# Patient Record
Sex: Male | Born: 1980 | Race: White | Hispanic: No | Smoking: Former smoker
Health system: Southern US, Community
[De-identification: ages and names within clinical notes are randomized; demographics above are authoritative.]

## PROBLEM LIST (undated history)

## (undated) DIAGNOSIS — E349 Endocrine disorder, unspecified: Secondary | ICD-10-CM

## (undated) DIAGNOSIS — E785 Hyperlipidemia, unspecified: Secondary | ICD-10-CM

## (undated) DIAGNOSIS — E039 Hypothyroidism, unspecified: Secondary | ICD-10-CM

## (undated) DIAGNOSIS — I10 Essential (primary) hypertension: Secondary | ICD-10-CM

## (undated) HISTORY — DX: Hypothyroidism, unspecified: E03.9

## (undated) HISTORY — DX: Essential (primary) hypertension: I10

## (undated) HISTORY — DX: Hyperlipidemia, unspecified: E78.5

## (undated) HISTORY — PX: NO PAST SURGERIES: SHX2092

## (undated) HISTORY — DX: Endocrine disorder, unspecified: E34.9

---

## 2004-07-09 ENCOUNTER — Emergency Department (HOSPITAL_COMMUNITY): Admission: EM | Admit: 2004-07-09 | Discharge: 2004-07-09 | Payer: Self-pay | Admitting: Family Medicine

## 2005-02-19 ENCOUNTER — Emergency Department (HOSPITAL_COMMUNITY): Admission: EM | Admit: 2005-02-19 | Discharge: 2005-02-19 | Payer: Self-pay | Admitting: Family Medicine

## 2015-03-09 ENCOUNTER — Ambulatory Visit (INDEPENDENT_AMBULATORY_CARE_PROVIDER_SITE_OTHER): Payer: 59 | Admitting: Physician Assistant

## 2015-03-09 VITALS — BP 140/100 | HR 89 | Temp 98.5°F | Resp 16 | Ht 69.5 in | Wt 228.8 lb

## 2015-03-09 DIAGNOSIS — R0981 Nasal congestion: Secondary | ICD-10-CM

## 2015-03-09 DIAGNOSIS — Z113 Encounter for screening for infections with a predominantly sexual mode of transmission: Secondary | ICD-10-CM

## 2015-03-09 DIAGNOSIS — Z23 Encounter for immunization: Secondary | ICD-10-CM | POA: Diagnosis not present

## 2015-03-09 DIAGNOSIS — Z8679 Personal history of other diseases of the circulatory system: Secondary | ICD-10-CM | POA: Diagnosis not present

## 2015-03-09 DIAGNOSIS — Z139 Encounter for screening, unspecified: Secondary | ICD-10-CM

## 2015-03-09 DIAGNOSIS — IMO0001 Reserved for inherently not codable concepts without codable children: Secondary | ICD-10-CM

## 2015-03-09 DIAGNOSIS — Z0000 Encounter for general adult medical examination without abnormal findings: Secondary | ICD-10-CM

## 2015-03-09 DIAGNOSIS — R030 Elevated blood-pressure reading, without diagnosis of hypertension: Secondary | ICD-10-CM

## 2015-03-09 LAB — BASIC METABOLIC PANEL
BUN: 12 mg/dL (ref 7–25)
CALCIUM: 10 mg/dL (ref 8.6–10.3)
CO2: 24 mmol/L (ref 20–31)
Chloride: 100 mmol/L (ref 98–110)
Creat: 0.97 mg/dL (ref 0.60–1.35)
GLUCOSE: 77 mg/dL (ref 65–99)
Potassium: 4 mmol/L (ref 3.5–5.3)
Sodium: 140 mmol/L (ref 135–146)

## 2015-03-09 LAB — LIPID PANEL
CHOL/HDL RATIO: 6.8 ratio — AB (ref <=5.0)
CHOLESTEROL: 169 mg/dL (ref 125–200)
HDL: 25 mg/dL — ABNORMAL LOW (ref >=40)
LDL Cholesterol: 80 mg/dL (ref <130)
TRIGLYCERIDES: 318 mg/dL — AB (ref <150)
VLDL: 64 mg/dL — AB (ref <30)

## 2015-03-09 LAB — TSH: TSH: 6.935 u[IU]/mL — ABNORMAL HIGH (ref 0.350–4.500)

## 2015-03-09 MED ORDER — LISINOPRIL 10 MG PO TABS: 10.0000 mg | ORAL_TABLET | Freq: Every day | ORAL | Status: DC

## 2015-03-09 NOTE — Addendum Note (Signed)
Addended by: Honor LohGARRISON, Dezaray Shibuya M on: 03/09/2015 03:16 PM   Modules accepted: Orders

## 2015-03-09 NOTE — Progress Notes (Signed)
  Medical screening examination/treatment/procedure(s) were performed by non-physician practitioner and as supervising physician I was immediately available for consultation/collaboration.     

## 2015-03-09 NOTE — Progress Notes (Signed)
03/09/2015 2:50 PM   DOB: 02/26/1981 / MRN: [id number]  SUBJECTIVE:  Scott Sutton is a 34 y.o. male presenting for an annual physical.  He does not smoke.  He does not exercise.  He does not have a PCP.  He is sexually active with males and has a partner who lives in [place].  This is a monogamous relationship.  He was checked for STI a few years ago and his partner was as well.    Complains of a cold that has been present for 7 days and reports this is getting better.  Continues to have some nasal sinus drainage.  No cough, headache, fever.  He has not been taking any meds.    He has No Known Allergies.   He  has a past medical history of Hypertension.    He  reports that he has never smoked. He does not have any smokeless tobacco history on file. He  has no sexual activity history on file. The patient  has no past surgical history on file.  His family history is not on file.  Review of Systems  Constitutional: Negative for fever and chills.  Eyes: Negative for blurred vision.  Respiratory: Negative for cough and shortness of breath.   Cardiovascular: Negative for chest pain.  Gastrointestinal: Negative for nausea and abdominal pain.  Genitourinary: Negative for dysuria, urgency and frequency.  Musculoskeletal: Negative for myalgias.  Skin: Negative for rash.  Neurological: Negative for dizziness, tingling and headaches.  Psychiatric/Behavioral: Negative for depression. The patient is not nervous/anxious.     Problem list and medications reviewed and updated by myself where necessary, and exist elsewhere in the encounter.   OBJECTIVE:  BP 140/100 mmHg  Pulse 89  Temp(Src) 98.5 F (36.9 C) (Oral)  Resp 16  Ht 5' 9.5" (1.765 m)  Wt 228 lb 12.8 oz (103.783 kg)  BMI 33.31 kg/m2  SpO2 98%  Physical Exam  Constitutional: He is oriented to person, place, and time. He appears well-developed. He does not appear ill.  Eyes: Conjunctivae and EOM are normal. Pupils are  equal, round, and reactive to light.  Cardiovascular: Normal rate.   Pulmonary/Chest: Effort normal.  Abdominal: He exhibits no distension.  Musculoskeletal: Normal range of motion.  Neurological: He is alert and oriented to person, place, and time. No cranial nerve deficit. Coordination normal.  Skin: Skin is warm and dry. He is not diaphoretic.  Psychiatric: He has a normal mood and affect.  Nursing note and vitals reviewed.   No results found for this or any previous visit (from the past 48 hour(s)).  ASSESSMENT AND PLAN  Miles was seen today for annual exam, sinus problem, nasal congestion and ear problem.  Diagnoses and all orders for this visit:  Annual physical exam  Screening -     CBC with Differential -     Hemoglobin A1c -     Lipid panel -     TSH -     Basic metabolic panel -     HIV antibody  Needs flu shot -     Flu Vaccine QUAD 36+ mos IM (Fluarix) (Patient declined)  Nasal sinus congestion: Likely viral.  He is feeling better.  Will give OTC reqs per AVS.    Routine screening for STI (sexually transmitted infection) -     RPR -     GC probe amplification, urine  Hypertension:  He has a history of this and BP is elevated today.  Will  restart his lisinopril today.     The patient was advised to call or return to clinic if he does not see an improvement in symptoms or to seek the care of the closest emergency department if he worsens with the above plan.   Deliah BostonMichael Kendarius Vigen, MHS, PA-C [facility] 03/09/2015 2:50 PM

## 2015-03-09 NOTE — Patient Instructions (Signed)
-   You have a viral upper respiratory infection, (the common cold) and it is not uncommon for symptoms to last 10 days to 2 weeks, regardless of which medications you take. Unfortunately antibiotics will not help your symptoms as they target bacteria, and you are likely suffering from a virus. Additionally, 1 in 8 people who take antibiotics suffer mild to severe side effects.    - You would benefit from high dose ibuprofen. TAKE 600-800 mg of ibuprofen every 8 hours as needed to control low grade fever, fatigue, and pain.    - I also recommend that you take Zyrtec-D 5-120 every morning or Claritin-D 10-240 for the next five to 10 days. This medication will help you with nasal congestion, post nasal drip and sneezing. You will have to purchase this directly from the pharmacist   Viral URI Medications: Please consult the pharmacist if you have questions. 600-800 mg ibuprofen every 8 hours as needed for the next five to ten days 5-120 Zyrtec-D every morning for five to 10 days OR Claritin D 10-240 every morning for five days.  Please visit the CDC's website https://www.cdc.gov/getsmart/ to learn about appropriate and inappropriate uses of antibiotics.    

## 2015-03-10 LAB — CBC WITH DIFFERENTIAL/PLATELET
BASOS ABS: 0.1 10*3/uL (ref 0.0–0.1)
Basophils Relative: 1 % (ref 0–1)
Eosinophils Absolute: 0.1 10*3/uL (ref 0.0–0.7)
Eosinophils Relative: 1 % (ref 0–5)
HEMATOCRIT: 49.8 % (ref 39.0–52.0)
HEMOGLOBIN: 17.4 g/dL — AB (ref 13.0–17.0)
LYMPHS ABS: 1.9 10*3/uL (ref 0.7–4.0)
LYMPHS PCT: 27 % (ref 12–46)
MCH: 30.9 pg (ref 26.0–34.0)
MCHC: 34.9 g/dL (ref 30.0–36.0)
MCV: 88.5 fL (ref 78.0–100.0)
MPV: 9.9 fL (ref 8.6–12.4)
Monocytes Absolute: 0.5 10*3/uL (ref 0.1–1.0)
Monocytes Relative: 7 % (ref 3–12)
NEUTROS PCT: 64 % (ref 43–77)
Neutro Abs: 4.6 10*3/uL (ref 1.7–7.7)
Platelets: 235 10*3/uL (ref 150–400)
RBC: 5.63 MIL/uL (ref 4.22–5.81)
RDW: 14.1 % (ref 11.5–15.5)
WBC: 7.2 10*3/uL (ref 4.0–10.5)

## 2015-03-10 LAB — RPR

## 2015-03-10 LAB — HIV ANTIBODY (ROUTINE TESTING W REFLEX): HIV: NONREACTIVE

## 2015-03-10 LAB — GC/CHLAMYDIA PROBE AMP
CT PROBE, AMP APTIMA: NOT DETECTED
GC PROBE AMP APTIMA: NOT DETECTED

## 2015-03-10 LAB — HEMOGLOBIN A1C
Hgb A1c MFr Bld: 5.3 % (ref <5.7)
Mean Plasma Glucose: 105 mg/dL (ref <117)

## 2015-09-13 ENCOUNTER — Ambulatory Visit: Payer: 59 | Admitting: Physician Assistant

## 2015-10-04 ENCOUNTER — Ambulatory Visit (INDEPENDENT_AMBULATORY_CARE_PROVIDER_SITE_OTHER): Payer: 59 | Admitting: Physician Assistant

## 2015-10-04 VITALS — BP 136/84 | HR 98 | Temp 98.6°F | Resp 18 | Ht 69.5 in | Wt 229.0 lb

## 2015-10-04 DIAGNOSIS — I10 Essential (primary) hypertension: Secondary | ICD-10-CM | POA: Diagnosis not present

## 2015-10-04 DIAGNOSIS — G4719 Other hypersomnia: Secondary | ICD-10-CM

## 2015-10-04 DIAGNOSIS — R21 Rash and other nonspecific skin eruption: Secondary | ICD-10-CM

## 2015-10-04 DIAGNOSIS — R946 Abnormal results of thyroid function studies: Secondary | ICD-10-CM | POA: Diagnosis not present

## 2015-10-04 DIAGNOSIS — L299 Pruritus, unspecified: Secondary | ICD-10-CM

## 2015-10-04 DIAGNOSIS — R7989 Other specified abnormal findings of blood chemistry: Secondary | ICD-10-CM

## 2015-10-04 LAB — POCT SKIN KOH: Skin KOH, POC: NEGATIVE

## 2015-10-04 LAB — T4, FREE: FREE T4: 1 ng/dL (ref 0.8–1.8)

## 2015-10-04 LAB — TSH: TSH: 10.7 m[IU]/L — AB (ref 0.40–4.50)

## 2015-10-04 LAB — T3, FREE: T3 FREE: 3.1 pg/mL (ref 2.3–4.2)

## 2015-10-04 MED ORDER — LISINOPRIL 10 MG PO TABS: 10.0000 mg | ORAL_TABLET | Freq: Every day | ORAL | 2 refills | Status: DC

## 2015-10-04 MED ORDER — METRONIDAZOLE 1 % EX CREA: TOPICAL_CREAM | Freq: Every day | CUTANEOUS | 0 refills | Status: DC

## 2015-10-04 MED ORDER — CLOBETASOL PROPIONATE 0.05 % EX SHAM: MEDICATED_SHAMPOO | CUTANEOUS | 0 refills | Status: DC

## 2015-10-04 NOTE — Progress Notes (Signed)
10/04/2015 1:21 PM   DOB: 1980-04-09 / MRN: [id number]  SUBJECTIVE:  Scott Sutton is a 35 y.o. male presenting for   He is here for a TSH check today. He last check showed a mild elevation in TSH.  He has not fasted.  He complains of poor memory.  He feels this does not affect his work. He denies HA, dizziness, changes in vision.  He does not know if he snores but has been told that he does snore.  He reports feeling sleepy and tired daily and feels this is getting worse.  He has never had a sleep study. Reports his snoring started once he started gaining weight.   He complains of dry skin.  He has tried "everything" and has tried increasing his omega three, biotin, lotion.  He has tried vaseline but not consistently. He reports the rash on his face has been present for about 6 years, and his scalp has also been itching since that time.    Depression screen PHQ 2/9 10/04/2015  Decreased Interest 0  Down, Depressed, Hopeless 0  PHQ - 2 Score 0    He has No Known Allergies.   He  has a past medical history of Hypertension.    He  reports that he has never smoked. He has never used smokeless tobacco. He reports that he drinks alcohol. He reports that he does not use drugs. He  has no sexual activity history on file. The patient  has no past surgical history on file.  His family history is not on file.  Review of Systems  Constitutional: Negative for chills and fever.  HENT: Negative for congestion.   Respiratory: Negative for cough and shortness of breath.   Cardiovascular: Negative for chest pain and leg swelling.  Gastrointestinal: Negative for nausea.  Musculoskeletal: Negative for myalgias.  Skin: Positive for itching and rash.  Neurological: Negative for dizziness, tingling, tremors, sensory change, speech change, focal weakness, seizures, loss of consciousness and headaches.  Psychiatric/Behavioral: Negative for depression. The patient is not nervous/anxious.     Problem  list and medications reviewed and updated by myself where necessary, and exist elsewhere in the encounter.   OBJECTIVE:  BP 136/84   Pulse 98   Temp 98.6 F (37 C) (Oral)   Resp 18   Ht 5' 9.5" (1.765 m)   Wt 229 lb (103.9 kg)   SpO2 99%   BMI 33.33 kg/m   Physical Exam  Constitutional: He is oriented to person, place, and time. Vital signs are normal. He appears well-developed and well-nourished. No distress.  HENT:  Nose: Nose normal.  Mouth/Throat: Oropharynx is clear and moist. No oropharyngeal exudate.  Eyes: EOM are normal. Pupils are equal, round, and reactive to light. Right eye exhibits no discharge. Left eye exhibits no discharge. No scleral icterus.  Cardiovascular: Normal rate, regular rhythm and normal heart sounds.   Pulmonary/Chest: Effort normal and breath sounds normal.  Abdominal: Soft. Bowel sounds are normal.  Musculoskeletal: Normal range of motion. He exhibits no edema, tenderness or deformity.  Lymphadenopathy:    He has no cervical adenopathy.  Neurological: He is alert and oriented to person, place, and time. No cranial nerve deficit. He exhibits normal muscle tone. Coordination normal.  Skin: Skin is warm and dry. Rash (facial and scalp, non tender, erythematous) noted. He is not diaphoretic. No erythema. No pallor.  Psychiatric: He has a normal mood and affect. His behavior is normal. Judgment and thought content normal.  Wt Readings from Last 3 Encounters:  10/04/15 229 lb (103.9 kg)  03/09/15 228 lb 12.8 oz (103.8 kg)   Lab Results  Component Value Date   WBC 7.2 03/09/2015   HGB 17.4 (H) 03/09/2015   HCT 49.8 03/09/2015   MCV 88.5 03/09/2015   PLT 235 03/09/2015   No results found for: ALT, AST, GGT, ALKPHOS, BILITOT Lab Results  Component Value Date   NA 140 03/09/2015   K 4.0 03/09/2015   CL 100 03/09/2015   CO2 24 03/09/2015   Lab Results  Component Value Date   HGBA1C 5.3 03/09/2015   Lab Results  Component Value Date    CREATININE 0.97 03/09/2015      Results for orders placed or performed in visit on 10/04/15 (from the past 72 hour(s))  POCT Skin KOH     Status: None   Collection Time: 10/04/15  1:07 PM  Result Value Ref Range   Skin KOH, POC Negative     No results found.  ASSESSMENT AND PLAN  Scott Sutton was seen today for other.  Diagnoses and all orders for this visit:  Essential hypertension: I rechecked this at 126/84.  Will controlled.  Will continue the current plan and see him back in 6 months.  -     lisinopril (PRINIVIL,ZESTRIL) 10 MG tablet; Take 1 tablet (10 mg total) by mouth daily. -     Care order/instruction  Itchy scalp -     POCT Skin KOH -     Clobetasol Propionate 0.05 % shampoo; Wash the scalp daily with this medication.  Excessive daytime sleepiness -     Ambulatory referral to Neurology  Elevated TSH -     TSH -     T4, Free -     T3, Free -     Thyroid Peroxidase Antibody  Facial rash -     metronidazole (NORITATE) 1 % cream; Apply topically daily.    The patient was advised to call or return to clinic if he does not see an improvement in symptoms, or to seek the care of the closest emergency department if he worsens with the above plan.   Deliah Boston, MHS, PA-C [facility] 10/04/2015 1:21 PM

## 2015-10-04 NOTE — Patient Instructions (Addendum)
1.  Do not use lotion on your face.  Okay to use the metronidazole cream and vaseline.    2.  Please go to the neurology appointment and discuss your symptoms with Dr. Lacey Jensen. She will perform a sleep study if she feels this is necessary.   3.  I am evaluating your thyroid function today and will contact you with the results soon.   4.  Let's go ahead and try the stroid shampoo for itchy scalp.  It is okay to continue using Head and Shoulders as well.   5.  Come back in 6 months or sooner if you have a problem you would like to discuss.    IF you received an x-ray today, you will receive an invoice from [facility]. Please contact [facility] at [phone] with questions or concerns regarding your invoice.   IF you received labwork today, you will receive an invoice from [employer]. Please contact [facility] at [phone] with questions or concerns regarding your invoice.   Our billing staff will not be able to assist you with questions regarding bills from these companies.  You will be contacted with the lab results as soon as they are available. The fastest way to get your results is to activate your My Chart account. Instructions are located on the last page of this paperwork. If you have not heard from [place] regarding the results in 2 weeks, please contact this office.

## 2015-10-05 ENCOUNTER — Encounter: Payer: Self-pay | Admitting: Physician Assistant

## 2015-10-05 DIAGNOSIS — E039 Hypothyroidism, unspecified: Secondary | ICD-10-CM

## 2015-10-05 LAB — THYROID PEROXIDASE ANTIBODY: Thyroperoxidase Ab SerPl-aCnc: 175 IU/mL — ABNORMAL HIGH (ref <9)

## 2015-10-05 MED ORDER — LEVOTHYROXINE SODIUM 25 MCG PO TABS: 25.0000 ug | ORAL_TABLET | Freq: Every day | ORAL | 0 refills | Status: DC

## 2015-10-09 ENCOUNTER — Institutional Professional Consult (permissible substitution): Payer: 59 | Admitting: Neurology

## 2015-10-09 ENCOUNTER — Telehealth: Payer: Self-pay

## 2015-10-09 NOTE — Telephone Encounter (Signed)
Pt did not show for their appt with Dr. Dohmeier today.  

## 2015-10-25 ENCOUNTER — Institutional Professional Consult (permissible substitution): Payer: 59 | Admitting: Neurology

## 2016-07-12 ENCOUNTER — Other Ambulatory Visit: Payer: Self-pay | Admitting: Physician Assistant

## 2016-07-12 DIAGNOSIS — I10 Essential (primary) hypertension: Secondary | ICD-10-CM

## 2016-12-19 ENCOUNTER — Other Ambulatory Visit: Payer: Self-pay | Admitting: Physician Assistant

## 2016-12-19 DIAGNOSIS — I10 Essential (primary) hypertension: Secondary | ICD-10-CM

## 2018-01-16 ENCOUNTER — Encounter (HOSPITAL_COMMUNITY): Payer: Self-pay

## 2018-01-16 ENCOUNTER — Ambulatory Visit (HOSPITAL_COMMUNITY)
Admission: EM | Admit: 2018-01-16 | Discharge: 2018-01-16 | Disposition: A | Discharge status: Home / Self Care | Payer: 59 | Attending: Internal Medicine | Admitting: Internal Medicine

## 2018-01-16 DIAGNOSIS — I10 Essential (primary) hypertension: Secondary | ICD-10-CM

## 2018-01-16 DIAGNOSIS — Z760 Encounter for issue of repeat prescription: Secondary | ICD-10-CM

## 2018-01-16 MED ORDER — LISINOPRIL 10 MG PO TABS: 10.0000 mg | ORAL_TABLET | Freq: Every day | ORAL | 0 refills | Status: DC

## 2018-01-16 NOTE — ED Provider Notes (Signed)
[facility]    CSN: [id number] Arrival date & time: 01/16/18  8295     History   Chief Complaint Chief Complaint  Patient presents with  . Medication Refill    HPI Scott Sutton is a 37 y.o. male.   Patient is a 37 year old male presents here for medication refill on his lisinopril.  He reports that he has not had his medication in approximately 1 year.  He has been between insurances and unable to get his medication.  He is currently searching for a new primary care provider. He denies any dizziness, headaches, chest pain, shortness of breath, palpitations, lower extremity edema.  ROS per HPI      Past Medical History:  Diagnosis Date  . Hypertension     There are no active problems to display for this patient.   History reviewed. No pertinent surgical history.     Home Medications    Prior to Admission medications   Medication Sig Start Date End Date Taking? Authorizing Provider  Clobetasol Propionate 0.05 % shampoo Wash the scalp daily with this medication. 10/04/15   Ofilia Neas, PA-C  levothyroxine (LEVOTHROID) 25 MCG tablet Take 1 tablet (25 mcg total) by mouth daily before breakfast. 10/05/15   Ofilia Neas, PA-C  lisinopril (PRINIVIL,ZESTRIL) 10 MG tablet Take 1 tablet (10 mg total) by mouth daily. Make appt with PA Clark within 60 days. 01/16/18   Dahlia Byes A, NP  metronidazole (NORITATE) 1 % cream Apply topically daily. 10/04/15   Ofilia Neas, PA-C    Family History History reviewed. No pertinent family history.  Social History Social History   Tobacco Use  . Smoking status: Never Smoker  . Smokeless tobacco: Never Used  Substance Use Topics  . Alcohol use: Yes    Alcohol/week: 0.0 standard drinks  . Drug use: No     Allergies   Patient has no known allergies.   Review of Systems Review of Systems   Physical Exam Triage Vital Signs ED Triage Vitals  Enc Vitals Group     BP 01/16/18 0952 (!) 155/107   Pulse Rate 01/16/18 0952 (!) 105     Resp 01/16/18 0952 18     Temp 01/16/18 0952 98.9 F (37.2 C)     Temp Source 01/16/18 0952 Oral     SpO2 01/16/18 0952 97 %     Weight --      Height --      Head Circumference --      Peak Flow --      Pain Score 01/16/18 1004 0     Pain Loc --      Pain Edu? --      Excl. in GC? --    No data found.  Updated Vital Signs BP (!) 155/107 (BP Location: Left Arm)   Pulse (!) 105   Temp 98.9 F (37.2 C) (Oral)   Resp 18   SpO2 97%   Visual Acuity Right Eye Distance:   Left Eye Distance:   Bilateral Distance:    Right Eye Near:   Left Eye Near:    Bilateral Near:     Physical Exam  Constitutional: He is oriented to person, place, and time. He appears well-developed and well-nourished.  Very pleasant. Non toxic or ill appearing.   HENT:  Head: Normocephalic and atraumatic.  Eyes: Conjunctivae are normal.  Neck: Normal range of motion.  Cardiovascular: Normal rate, regular rhythm and normal heart sounds.  Pulmonary/Chest:  Effort normal and breath sounds normal.  Lungs clear in all fields. No dyspnea or distress. No retractions or nasal flaring.   Musculoskeletal: Normal range of motion.  Neurological: He is alert and oriented to person, place, and time.  Skin: Skin is warm and dry.  Psychiatric: He has a normal mood and affect.  Nursing note and vitals reviewed.    UC Treatments / Results  Labs (all labs ordered are listed, but only abnormal results are displayed) Labs Reviewed - No data to display  EKG None  Radiology No results found.  Procedures Procedures (including critical care time)  Medications Ordered in UC Medications - No data to display  Initial Impression / Assessment and Plan / UC Course  I have reviewed the triage vital signs and the nursing notes.  Pertinent labs & imaging results that were available during my care of the patient were reviewed by me and considered in my medical decision making (see  chart for details).     Refilled patient's blood pressure medication Gave patient contacts to multiple different primary care providers and instructed to follow-up for further management of blood pressure  Final Clinical Impressions(s) / UC Diagnoses   Final diagnoses:  Medication refill     Discharge Instructions     Please follow up with primary care for further management of BP.     ED Prescriptions    Medication Sig Dispense Auth. Provider   lisinopril (PRINIVIL,ZESTRIL) 10 MG tablet Take 1 tablet (10 mg total) by mouth daily. Make appt with PA Clark within 60 days. 60 tablet Dahlia Byes A, NP     Controlled Substance Prescriptions [place] Controlled Substance Registry consulted? Not Applicable   Janace Aris, NP 01/16/18 1151

## 2018-01-16 NOTE — ED Triage Notes (Signed)
Pt presents for medication refill for lisinopril.  Pt states he has not had it for 1 year because he does not have primary care yet.

## 2018-01-16 NOTE — Discharge Instructions (Signed)
Please follow up with primary care for further management of BP.

## 2018-04-12 NOTE — Progress Notes (Signed)
NEW PATIENT  Assessment and Plan:  Smoker -     DG Chest 2 View; Future - advised to quit vaping  Essential hypertension Start on lisinoprill 40, follow up 1 month With family history of discussed possibly getting MRA- declines at this time due to cost- will continue to monitor Discussed symptoms of hemorragic stroke -     CBC with Differential/Platelet -     COMPLETE METABOLIC PANEL WITH GFR -     TSH -     lisinopril (PRINIVIL,ZESTRIL) 40 MG tablet; Take 1 tablet (40 mg total) by mouth daily. -     DG Chest 2 View; Future  Hypothyroidism due to Hashimoto's thyroiditis Will start on meds pending thyroid -     TSH  Class 2 severe obesity due to excess calories with serious comorbidity and body mass index (BMI) of 35.0 to 35.9 in adult [facility]) - follow up 3 months for progress monitoring - increase veggies, decrease carbs - long discussion about weight loss, diet, and exercise -     Lipid panel -     Hemoglobin A1c  Medication management -     Magnesium  Vitamin D deficiency -     VITAMIN D 25 Hydroxy (Vit-D Deficiency, Fractures)  Screening, anemia, deficiency, iron -     Iron,Total/Total Iron Binding Cap -     Vitamin B12  Fatigue, unspecified type Need to correct thyroid Will check for other causes of fatigue Partner states that he does not snore- does work 3rd shift -     Iron,Total/Total Iron Binding Cap -     Vitamin B12 -     Testosterone  Eczema, unspecified type Correct thyroid and any def first  History of migraine Will monitor. Have not had lately   Discussed med's effects and SE's. Screening labs and tests as requested with regular follow-up as recommended. Over 40 minutes of exam, counseling, chart review and critical decision making was performed  HPI Patient presents for a complete physical as a new patient.   His blood pressure has been controlled at home, has had BP x teens, today their BP is BP: (!) 160/110.  He works 3rd shift. He does  not snore, does not stop breathing.  He is NOT on thyroid medication. He has history of low thyroid with  Lab Results  Component Value Date   TSH 10.70 (H) 10/04/2015  .  He has history of skin issues, told it is eczema, states clobetasol did not help.   He does not workout. He denies chest pain, shortness of breath, dizziness.   BMI is Body mass index is 35.37 kg/m., he is working on diet and exercise. Wt Readings from Last 3 Encounters:  04/13/18 243 lb (110.2 kg)  10/04/15 229 lb (103.9 kg)  03/09/15 228 lb 12.8 oz (103.8 kg)   The cholesterol last visit was:   Lab Results  Component Value Date   CHOL 169 03/09/2015   HDL 25 (L) 03/09/2015   LDLCALC 80 03/09/2015   TRIG 318 (H) 03/09/2015   CHOLHDL 6.8 (H) 03/09/2015    Last A1C in the office was:  Lab Results  Component Value Date   HGBA1C 5.3 03/09/2015    Current Medications:  Current Outpatient Medications on File Prior to Visit  Medication Sig Dispense Refill  . levothyroxine (LEVOTHROID) 25 MCG tablet Take 1 tablet (25 mcg total) by mouth daily before breakfast. 90 tablet 0   No current facility-administered medications on file prior to visit.  Allergies:  No Known Allergies   Health Maintenance:  There is no immunization history for the selected administration types on file for this patient.  Tetanus: Pneumovax: Prevnar 13: Flu vaccine: Zostavax:  DEXA: N/A Colonoscopy: due age 38 EGD: Eye Exam: Dentist:  Patient Care Team: Lucky CowboyMcKeown, William, MD as PCP - General (Internal Medicine)  Medical History:  has Hypertension; Hypothyroidism; and Smoker on their problem list. Surgical History:  He  has no past surgical history on file. Family History:  His family history includes AAA (abdominal aortic aneurysm) in his maternal grandmother; Aneurysm in his father; Cancer in his maternal grandfather and mother; Crohn's disease in his sister; Diabetes in his paternal aunt and paternal uncle;  Hypertension in his father and sister; Stroke in his father, maternal grandmother, and paternal grandfather. Social History:   reports that he quit smoking about 5 years ago. His smoking use included cigarettes. He has a 20.00 pack-year smoking history. He has never used smokeless tobacco. He reports previous alcohol use. He reports that he does not use drugs.   Review of Systems:  Review of Systems  Constitutional: Positive for malaise/fatigue. Negative for chills, diaphoresis, fever and weight loss.  HENT: Negative.   Eyes: Negative.   Respiratory: Negative.   Cardiovascular: Negative.   Gastrointestinal: Negative.   Genitourinary: Negative.   Musculoskeletal: Negative.   Skin: Negative.     Physical Exam: Estimated body mass index is 35.37 kg/m as calculated from the following:   Height as of this encounter: 5' 9.5" (1.765 m).   Weight as of this encounter: 243 lb (110.2 kg). BP (!) 160/110   Pulse 67   Temp 98.1 F (36.7 C)   Ht 5' 9.5" (1.765 m)   Wt 243 lb (110.2 kg)   SpO2 98%   BMI 35.37 kg/m  General Appearance: Well nourished, in no apparent distress.  Eyes: PERRLA, EOMs, conjunctiva no swelling or erythema, normal fundi and vessels.  Sinuses: No Frontal/maxillary tenderness  ENT/Mouth: Ext aud canals clear, normal light reflex with TMs without erythema, bulging. Good dentition. No erythema, swelling, or exudate on post pharynx. Tonsils not swollen or erythematous. Hearing normal.  Neck: Supple, thyroid normal. No bruits  Respiratory: Respiratory effort normal, BS equal bilaterally without rales, rhonchi, wheezing or stridor.  Cardio: RRR without murmurs, rubs or gallops. Brisk peripheral pulses without edema.  Chest: symmetric, with normal excursions and percussion.  Abdomen: Soft, nontender, no guarding, rebound, hernias, masses, or organomegaly.  Lymphatics: Non tender without lymphadenopathy.  Genitourinary:  Musculoskeletal: Full ROM all peripheral  extremities,5/5 strength, and normal gait.  Skin: Warm, dry without rashes, lesions, ecchymosis. Neuro: Cranial nerves intact, reflexes equal bilaterally. Normal muscle tone, no cerebellar symptoms. Sensation intact.  Psych: Awake and oriented X 3, normal affect, Insight and Judgment appropriate.   EKG: WNL no changes. AORTA SCAN: defer  Quentin Mullingmanda Nadya Hopwood 7:37 PM [facility]

## 2018-04-13 ENCOUNTER — Ambulatory Visit (INDEPENDENT_AMBULATORY_CARE_PROVIDER_SITE_OTHER): Payer: 59 | Admitting: Physician Assistant

## 2018-04-13 ENCOUNTER — Encounter: Payer: Self-pay | Admitting: Physician Assistant

## 2018-04-13 VITALS — BP 160/110 | HR 67 | Temp 98.1°F | Ht 69.5 in | Wt 243.0 lb

## 2018-04-13 DIAGNOSIS — R5383 Other fatigue: Secondary | ICD-10-CM

## 2018-04-13 DIAGNOSIS — E66812 Obesity, class 2: Secondary | ICD-10-CM

## 2018-04-13 DIAGNOSIS — Z6835 Body mass index (BMI) 35.0-35.9, adult: Secondary | ICD-10-CM

## 2018-04-13 DIAGNOSIS — E038 Other specified hypothyroidism: Secondary | ICD-10-CM | POA: Diagnosis not present

## 2018-04-13 DIAGNOSIS — Z8669 Personal history of other diseases of the nervous system and sense organs: Secondary | ICD-10-CM

## 2018-04-13 DIAGNOSIS — I10 Essential (primary) hypertension: Secondary | ICD-10-CM

## 2018-04-13 DIAGNOSIS — Z79899 Other long term (current) drug therapy: Secondary | ICD-10-CM | POA: Diagnosis not present

## 2018-04-13 DIAGNOSIS — Z130 Encounter for screening for diseases of the blood and blood-forming organs and certain disorders involving the immune mechanism: Secondary | ICD-10-CM

## 2018-04-13 DIAGNOSIS — F17200 Nicotine dependence, unspecified, uncomplicated: Secondary | ICD-10-CM

## 2018-04-13 DIAGNOSIS — E039 Hypothyroidism, unspecified: Secondary | ICD-10-CM | POA: Insufficient documentation

## 2018-04-13 DIAGNOSIS — L309 Dermatitis, unspecified: Secondary | ICD-10-CM

## 2018-04-13 DIAGNOSIS — E559 Vitamin D deficiency, unspecified: Secondary | ICD-10-CM

## 2018-04-13 DIAGNOSIS — E063 Autoimmune thyroiditis: Secondary | ICD-10-CM | POA: Diagnosis not present

## 2018-04-13 MED ORDER — LISINOPRIL 40 MG PO TABS: 40.0000 mg | ORAL_TABLET | Freq: Every day | ORAL | 1 refills | Status: DC

## 2018-04-13 NOTE — Patient Instructions (Addendum)
Do 1/2 pill for 3-5 days and then increase to a whole pill of your lisinopril  INFORMATION ABOUT YOUR XRAY  [facility] [place] go to radiology and give them your name. They will have the order and take you back. You do not any paper work, I should get the result back today or tomorrow. This order is good for a year.    Hypothyroidism  Hypothyroidism is when the thyroid gland does not make enough of certain hormones (it is underactive). The thyroid gland is a small gland located in the lower front part of the neck, just in front of the windpipe (trachea). This gland makes hormones that help control how the body uses food for energy (metabolism) as well as how the heart and brain function. These hormones also play a role in keeping your bones strong. When the thyroid is underactive, it produces too little of the hormones thyroxine (T4) and triiodothyronine (T3). What are the causes? This condition may be caused by:  Hashimoto's disease. This is a disease in which the body's disease-fighting system (immune system) attacks the thyroid gland. This is the most common cause.  Viral infections.  Pregnancy.  Certain medicines.  Birth defects.  Past radiation treatments to the head or neck for cancer.  Past treatment with radioactive iodine.  Past exposure to radiation in the environment.  Past surgical removal of part or all of the thyroid.  Problems with a gland in the center of the brain (pituitary gland).  Lack of enough iodine in the diet. What increases the risk? You are more likely to develop this condition if:  You are male.  You have a family history of thyroid conditions.  You use a medicine called lithium.  You take medicines that affect the immune system (immunosuppressants). What are the signs or symptoms? Symptoms of this condition include:  Feeling as though you have no energy (lethargy).  Not being able to tolerate cold.  Weight gain that is  not explained by a change in diet or exercise habits.  Lack of appetite.  Dry skin.  Coarse hair.  Menstrual irregularity.  Slowing of thought processes.  Constipation.  Sadness or depression. How is this diagnosed? This condition may be diagnosed based on:  Your symptoms, your medical history, and a physical exam.  Blood tests. You may also have imaging tests, such as an ultrasound or MRI. How is this treated? This condition is treated with medicine that replaces the thyroid hormones that your body does not make. After you begin treatment, it may take several weeks for symptoms to go away. Follow these instructions at home:  Take over-the-counter and prescription medicines only as told by your health care provider.  If you start taking any new medicines, tell your health care provider.  Keep all follow-up visits as told by your health care provider. This is important. ? As your condition improves, your dosage of thyroid hormone medicine may change. ? You will need to have blood tests regularly so that your health care provider can monitor your condition. Contact a health care provider if:  Your symptoms do not get better with treatment.  You are taking thyroid replacement medicine and you: ? Sweat a lot. ? Have tremors. ? Feel anxious. ? Lose weight rapidly. ? Cannot tolerate heat. ? Have emotional swings. ? Have diarrhea. ? Feel weak. Get help right away if you have:  Chest pain.  An irregular heartbeat.  A rapid heartbeat.  Difficulty breathing. Summary  Hypothyroidism is when the thyroid gland does not make enough of certain hormones (it is underactive).  When the thyroid is underactive, it produces too little of the hormones thyroxine (T4) and triiodothyronine (T3).  The most common cause is Hashimoto's disease, a disease in which the body's disease-fighting system (immune system) attacks the thyroid gland. The condition can also be caused by viral  infections, medicine, pregnancy, or past radiation treatment to the head or neck.  Symptoms may include weight gain, dry skin, constipation, feeling as though you do not have energy, and not being able to tolerate cold.  This condition is treated with medicine to replace the thyroid hormones that your body does not make. This information is not intended to replace advice given to you by your health care provider. Make sure you discuss any questions you have with your health care provider. Document Released: 02/25/2005 Document Revised: 02/05/2017 Document Reviewed: 02/05/2017 Elsevier Interactive Patient Education  2019 [employer]   Silent reflux: Not all heartburn burns...Marland KitchenMarland KitchenMarland Kitchen  What is LPR? Laryngopharyngeal reflux (LPR) or silent reflux is a condition in which acid that is made in the stomach travels up the esophagus (swallowing tube) and gets to the throat. Not everyone with reflux has a lot of heartburn or indigestion. In fact, many people with LPR never have heartburn. This is why LPR is called SILENT REFLUX, and the terms "Silent reflux" and "LPR" are often used interchangeably. Because LPR is silent, it is sometimes difficult to diagnose.  How can you tell if you have LPR?  Marland Kitchen Chronic hoarseness- Some people have hoarseness that comes and goes . throat clearing  . Cough . It can cause shortness of breath and cause asthma like symptoms. Marland Kitchen a feeling of a lump in the throat  . difficulty swallowing . a problem with too much nose and throat drainage.  . Some people will feel their esophagus spasm which feels like their heart beating hard and fast, this will usually be after a meal, at rest, or lying down at night.    How do I treat this? Treatment for LPR should be individualized, and your doctor will suggest the best treatment for you. Generally there are several treatments for LPR: . changing habits and diet to reduce reflux,  . medications to reduce stomach acid, and  . surgery  to prevent reflux. Most people with LPR need to modify how and when they eat, as well as take some medication, to get well. Sometimes, nonprescription liquid antacids, such as Maalox, Gelucil and Mylanta are recommended. When used, these antacids should be taken four times each day - one tablespoon one hour after each meal and before bedtime. Dietary and lifestyle changes alone are not often enough to control LPR - medications that reduce stomach acid are also usually needed. These must be prescribed by our doctor.   TIPS FOR REDUCING REFLUX AND LPR Control your LIFE-STYLE and your DIET! Marland Kitchen If you use tobacco, QUIT.  Marland Kitchen Smoking makes you reflux. After every cigarette you have some LPR.  . Don't wear clothing that is too tight, especially around the waist (trousers, corsets, belts).  . Do not lie down just after eating...in fact, do not eat within three hours of bedtime.  . You should be on a low-fat diet.  . Limit your intake of red meat.  . Limit your intake of butter.  Marland Kitchen Avoid fried foods.  . Avoid chocolate  . Avoid cheese.  Marland Kitchen Avoid eggs. Marland Kitchen Specifically avoid caffeine (especially  coffee and tea), soda pop (especially cola) and mints.  . Avoid alcoholic beverages, particularly in the evening.

## 2018-04-14 ENCOUNTER — Other Ambulatory Visit: Payer: Self-pay | Admitting: Physician Assistant

## 2018-04-14 LAB — TESTOSTERONE: Testosterone: 256 ng/dL (ref 250–827)

## 2018-04-14 LAB — COMPLETE METABOLIC PANEL WITH GFR
AG Ratio: 1.8 (calc) (ref 1.0–2.5)
ALBUMIN MSPROF: 4.9 g/dL (ref 3.6–5.1)
ALT: 32 U/L (ref 9–46)
AST: 18 U/L (ref 10–40)
Alkaline phosphatase (APISO): 65 U/L (ref 36–130)
BILIRUBIN TOTAL: 0.3 mg/dL (ref 0.2–1.2)
BUN: 22 mg/dL (ref 7–25)
CO2: 27 mmol/L (ref 20–32)
CREATININE: 1.2 mg/dL (ref 0.60–1.35)
Calcium: 10.1 mg/dL (ref 8.6–10.3)
Chloride: 102 mmol/L (ref 98–110)
GFR, EST AFRICAN AMERICAN: 88 mL/min/{1.73_m2} (ref >=60)
GFR, EST NON AFRICAN AMERICAN: 76 mL/min/{1.73_m2} (ref >=60)
GLUCOSE: 98 mg/dL (ref 65–99)
Globulin: 2.8 g/dL (calc) (ref 1.9–3.7)
Potassium: 3.8 mmol/L (ref 3.5–5.3)
Sodium: 141 mmol/L (ref 135–146)
TOTAL PROTEIN: 7.7 g/dL (ref 6.1–8.1)

## 2018-04-14 LAB — CBC WITH DIFFERENTIAL/PLATELET
ABSOLUTE MONOCYTES: 755 {cells}/uL (ref 200–950)
BASOS ABS: 69 {cells}/uL (ref 0–200)
Basophils Relative: 0.9 %
EOS PCT: 1.3 %
Eosinophils Absolute: 100 cells/uL (ref 15–500)
HEMATOCRIT: 48.2 % (ref 38.5–50.0)
HEMOGLOBIN: 17.2 g/dL — AB (ref 13.2–17.1)
LYMPHS ABS: 2302 {cells}/uL (ref 850–3900)
MCH: 31.7 pg (ref 27.0–33.0)
MCHC: 35.7 g/dL (ref 32.0–36.0)
MCV: 88.9 fL (ref 80.0–100.0)
MPV: 10.2 fL (ref 7.5–12.5)
Monocytes Relative: 9.8 %
NEUTROS ABS: 4474 {cells}/uL (ref 1500–7800)
NEUTROS PCT: 58.1 %
Platelets: 284 10*3/uL (ref 140–400)
RBC: 5.42 10*6/uL (ref 4.20–5.80)
RDW: 13 % (ref 11.0–15.0)
Total Lymphocyte: 29.9 %
WBC: 7.7 10*3/uL (ref 3.8–10.8)

## 2018-04-14 LAB — HEMOGLOBIN A1C
HEMOGLOBIN A1C: 5.4 %{Hb} (ref <5.7)
MEAN PLASMA GLUCOSE: 108 (calc)
eAG (mmol/L): 6 (calc)

## 2018-04-14 LAB — VITAMIN D 25 HYDROXY (VIT D DEFICIENCY, FRACTURES): Vit D, 25-Hydroxy: 8 ng/mL — ABNORMAL LOW (ref 30–100)

## 2018-04-14 LAB — LIPID PANEL
CHOL/HDL RATIO: 8.7 (calc) — AB (ref <5.0)
Cholesterol: 234 mg/dL — ABNORMAL HIGH (ref <200)
HDL: 27 mg/dL — ABNORMAL LOW (ref >=40)
NON-HDL CHOLESTEROL (CALC): 207 mg/dL — AB (ref <130)
Triglycerides: 1196 mg/dL — ABNORMAL HIGH (ref <150)

## 2018-04-14 LAB — IRON, TOTAL/TOTAL IRON BINDING CAP
%SAT: 23 % (calc) (ref 20–48)
Iron: 67 ug/dL (ref 50–180)
TIBC: 288 mcg/dL (calc) (ref 250–425)

## 2018-04-14 LAB — TSH: TSH: 10.54 mIU/L — ABNORMAL HIGH (ref 0.40–4.50)

## 2018-04-14 LAB — MAGNESIUM: Magnesium: 2.4 mg/dL (ref 1.5–2.5)

## 2018-04-14 LAB — VITAMIN B12: Vitamin B-12: 322 pg/mL (ref 200–1100)

## 2018-04-14 MED ORDER — FENOFIBRATE 145 MG PO TABS: 145.0000 mg | ORAL_TABLET | Freq: Every day | ORAL | 1 refills | Status: DC

## 2018-04-14 MED ORDER — VITAMIN D (ERGOCALCIFEROL) 1.25 MG (50000 UNIT) PO CAPS: ORAL_CAPSULE | ORAL | 0 refills | Status: DC

## 2018-04-14 MED ORDER — LEVOTHYROXINE SODIUM 75 MCG PO TABS: 75.0000 ug | ORAL_TABLET | Freq: Every day | ORAL | 1 refills | Status: DC

## 2018-04-14 NOTE — Progress Notes (Signed)
PRINTED

## 2018-05-13 DIAGNOSIS — Z6835 Body mass index (BMI) 35.0-35.9, adult: Secondary | ICD-10-CM

## 2018-05-13 DIAGNOSIS — Z8669 Personal history of other diseases of the nervous system and sense organs: Secondary | ICD-10-CM | POA: Insufficient documentation

## 2018-05-13 DIAGNOSIS — E7800 Pure hypercholesterolemia, unspecified: Secondary | ICD-10-CM | POA: Insufficient documentation

## 2018-05-13 DIAGNOSIS — E559 Vitamin D deficiency, unspecified: Secondary | ICD-10-CM | POA: Insufficient documentation

## 2018-05-13 DIAGNOSIS — L309 Dermatitis, unspecified: Secondary | ICD-10-CM | POA: Insufficient documentation

## 2018-05-13 NOTE — Progress Notes (Signed)
CPE  Assessment and Plan:  Essential hypertension - continue medications, DASH diet, exercise and monitor at home. Call if greater than 130/80.  -     TSH -     Urinalysis, Routine w reflex microscopic -     Microalbumin / creatinine urine ratio -     EKG 12-Lead -     DG Chest 2 View; Future  Hypothyroidism due to Hashimoto's thyroiditis -     TSH Hypothyroidism-check TSH level, continue medications the same, reminded to take on an empty stomach 30-30mins before food.   Smoker -     DG Chest 2 View; Future Smoking cessation-  instruction/counseling given, counseled patient on the dangers of tobacco use, advised patient to stop smoking, and reviewed strategies to maximize success, patient not ready to quit at this time.   Medication management  Class 2 severe obesity due to excess calories with serious comorbidity and body mass index (BMI) of 35.0 to 35.9 in adult [facility])  Overweight  - long discussion about weight loss, diet, and exercise -recommended diet heavy in fruits and veggies and low in animal meats, cheeses, and dairy products  Encounter for general adult medical examination with abnormal findings 1 year  Vitamin D deficiency -     VITAMIN D 25 Hydroxy (Vit-D Deficiency, Fractures)  Eczema, unspecified type -     ketoconazole (NIZORAL) 2 % cream; Apply 1 application topically 2 (two) times daily. -     fluocinonide cream (LIDEX) 0.05 %; Apply 1 application topically 2 (two) times daily.  History of migraine Have been controlled, with family history did discussed possible MRA however declines due to cost.   Hypercholesteremia -     Lipid panel - on fenofirbate, TSH better controlled -? Add on statin versus vacepa for trigs   Discussed med's effects and SE's. Screening labs and tests as requested with regular follow-up as recommended. Over 40 minutes of exam, counseling, chart review and critical decision making was performed  HPI Patient presents for a  complete physical as a new patient.   His blood pressure has been controlled at home, started on lisinopril 40 mg last visit, has had BP x teens, today their BP is BP: 136/84.  He works 3rd shift. He does not snore, does not stop breathing per partner.   B12 was low last visit, added supplement Lab Results  Component Value Date   VITAMINB12 322 04/13/2018   He was started on thyroid medication last visit. He has history of low thyroid with  Lab Results  Component Value Date   TSH 10.54 (H) 04/13/2018  .  He has history of skin issues, told it is eczema, has improved.   He does not workout. He denies chest pain, shortness of breath, dizziness.   BMI is Body mass index is 35.31 kg/m., he is working on diet and exercise. His testosterone was low normal last time.  Wt Readings from Last 3 Encounters:  05/15/18 242 lb 9.6 oz (110 kg)  04/13/18 243 lb (110.2 kg)  10/04/15 229 lb (103.9 kg)   Lab Results  Component Value Date   TESTOSTERONE 256 04/13/2018   The cholesterol last visit was VERY elevated putting him on risk for pancreatitis, need to recheck with thyroid correction, if not better will add on vasepa:   Lab Results  Component Value Date   CHOL 234 (H) 04/13/2018   HDL 27 (L) 04/13/2018   LDLCALC  04/13/2018     Comment:     .  LDL cholesterol not calculated. Triglyceride levels greater than 400 mg/dL invalidate calculated LDL results. . Reference range: <100 . Desirable range <100 mg/dL for primary prevention;   <70 mg/dL for patients with CHD or diabetic patients  with > or = 2 CHD risk factors. Marland Kitchen LDL-C is now calculated using the Martin-Hopkins  calculation, which is a validated novel method providing  better accuracy than the Friedewald equation in the  estimation of LDL-C.  Horald Pollen et al. Lenox Ahr. 4098;119(14): 2061-2068  (http://education.[facility].com/faq/FAQ164)    TRIG 1,196 (H) 04/13/2018   CHOLHDL 8.7 (H) 04/13/2018    Last A1C in the  office was:  Lab Results  Component Value Date   HGBA1C 5.4 04/13/2018   He is on 1 pill a week of vitamin D, will recheck today as well.  Vit D, 25-Hydroxy  Date Value Ref Range Status  04/13/2018 8 (L) 30 - 100 ng/mL Final    Comment:    Vitamin D Status         25-OH Vitamin D: . Deficiency:                    <20 ng/mL Insufficiency:             20 - 29 ng/mL Optimal:                 > or = 30 ng/mL . For 25-OH Vitamin D testing on patients on  D2-supplementation and patients for whom quantitation  of D2 and D3 fractions is required, the QuestAssureD(TM) 25-OH VIT D, (D2,D3), LC/MS/MS is recommended: order  code 78295 (patients >38yrs). . For more information on this test, go to: http://education.[facility].com/faq/FAQ163 (This link is being provided for  informational/educational purposes only.)     Current Medications:  Current Outpatient Medications on File Prior to Visit  Medication Sig Dispense Refill  . fenofibrate (TRICOR) 145 MG tablet Take 1 tablet (145 mg total) by mouth daily. 90 tablet 1  . levothyroxine (SYNTHROID) 75 MCG tablet Take 1 tablet (75 mcg total) by mouth daily. 90 tablet 1  . lisinopril (PRINIVIL,ZESTRIL) 40 MG tablet Take 1 tablet (40 mg total) by mouth daily. 90 tablet 1  . Vitamin D, Ergocalciferol, (DRISDOL) 1.25 MG (50000 UT) CAPS capsule 1 pill weekly for vitamin d deficiency 8 capsule 0   No current facility-administered medications on file prior to visit.    Allergies:  No Known Allergies   Health Maintenance:  There is no immunization history for the selected administration types on file for this patient.  Tetanus: DUE but declines Pneumovax: Prevnar 13: Flu vaccine: DECLINES Zostavax:  DEXA: N/A Colonoscopy: due age 24 EGD:  Patient Care Team: Lucky Cowboy, MD as PCP - General (Internal Medicine)  Medical History:  has Hypertension; Hypothyroidism; Smoker; Class 2 severe obesity due to excess calories with  serious comorbidity and body mass index (BMI) of 35.0 to 35.9 in adult [facility]); Vitamin D deficiency; Eczema; History of migraine; and Hypercholesteremia on their problem list. Surgical History:  He  has no past surgical history on file. Family History:  His family history includes AAA (abdominal aortic aneurysm) in his maternal grandmother; Aneurysm in his father; Cancer in his maternal grandfather and mother; Crohn's disease in his sister; Diabetes in his paternal aunt and paternal uncle; Hypertension in his father and sister; Stroke in his father, maternal grandmother, and paternal grandfather. Social History:   reports that he quit smoking about 5 years ago. His smoking use included cigarettes. He  has a 20.00 pack-year smoking history. He has never used smokeless tobacco. He reports previous alcohol use. He reports that he does not use drugs.   Review of Systems:  Review of Systems  Constitutional: Positive for malaise/fatigue. Negative for chills, diaphoresis, fever and weight loss.  HENT: Negative.   Eyes: Negative.   Respiratory: Negative.   Cardiovascular: Negative.   Gastrointestinal: Negative.   Genitourinary: Negative.   Musculoskeletal: Negative.   Skin: Negative.     Physical Exam: Estimated body mass index is 35.31 kg/m as calculated from the following:   Height as of this encounter: 5' 9.5" (1.765 m).   Weight as of this encounter: 242 lb 9.6 oz (110 kg). BP 136/84   Pulse 87   Temp 98.3 F (36.8 C)   Ht 5' 9.5" (1.765 m)   Wt 242 lb 9.6 oz (110 kg)   SpO2 98%   BMI 35.31 kg/m  General Appearance: Well nourished, in no apparent distress.  Eyes: PERRLA, EOMs, conjunctiva no swelling or erythema, normal fundi and vessels.  Sinuses: No Frontal/maxillary tenderness  ENT/Mouth: Ext aud canals clear, normal light reflex with TMs without erythema, bulging. Good dentition. No erythema, swelling, or exudate on post pharynx. Tonsils not swollen or erythematous. Hearing  normal.  Neck: Supple, thyroid normal. No bruits  Respiratory: Respiratory effort normal, BS equal bilaterally without rales, rhonchi, wheezing or stridor.  Cardio: RRR without murmurs, rubs or gallops. Brisk peripheral pulses without edema.  Chest: symmetric, with normal excursions and percussion.  Abdomen: Soft, nontender, no guarding, rebound, hernias, masses, or organomegaly.  Lymphatics: Non tender without lymphadenopathy.  Musculoskeletal: Full ROM all peripheral extremities,5/5 strength, and normal gait.  Skin: Warm, dry without rashes, lesions, ecchymosis. Neuro: Cranial nerves intact, reflexes equal bilaterally. Normal muscle tone, no cerebellar symptoms. Sensation intact.  Psych: Awake and oriented X 3, normal affect, Insight and Judgment appropriate.   EKG: WNL no ST changes. AORTA SCAN: defer  Quentin Mulling 8:43 AM [facility]

## 2018-05-15 ENCOUNTER — Encounter: Payer: Self-pay | Admitting: Physician Assistant

## 2018-05-15 ENCOUNTER — Ambulatory Visit (INDEPENDENT_AMBULATORY_CARE_PROVIDER_SITE_OTHER): Payer: 59 | Admitting: Physician Assistant

## 2018-05-15 VITALS — BP 136/84 | HR 87 | Temp 98.3°F | Ht 69.5 in | Wt 242.6 lb

## 2018-05-15 DIAGNOSIS — Z1389 Encounter for screening for other disorder: Secondary | ICD-10-CM | POA: Diagnosis not present

## 2018-05-15 DIAGNOSIS — Z1329 Encounter for screening for other suspected endocrine disorder: Secondary | ICD-10-CM | POA: Diagnosis not present

## 2018-05-15 DIAGNOSIS — Z79899 Other long term (current) drug therapy: Secondary | ICD-10-CM

## 2018-05-15 DIAGNOSIS — L309 Dermatitis, unspecified: Secondary | ICD-10-CM

## 2018-05-15 DIAGNOSIS — Z0000 Encounter for general adult medical examination without abnormal findings: Secondary | ICD-10-CM

## 2018-05-15 DIAGNOSIS — Z136 Encounter for screening for cardiovascular disorders: Secondary | ICD-10-CM

## 2018-05-15 DIAGNOSIS — F17200 Nicotine dependence, unspecified, uncomplicated: Secondary | ICD-10-CM

## 2018-05-15 DIAGNOSIS — E7800 Pure hypercholesterolemia, unspecified: Secondary | ICD-10-CM

## 2018-05-15 DIAGNOSIS — E063 Autoimmune thyroiditis: Secondary | ICD-10-CM

## 2018-05-15 DIAGNOSIS — E559 Vitamin D deficiency, unspecified: Secondary | ICD-10-CM | POA: Diagnosis not present

## 2018-05-15 DIAGNOSIS — Z13220 Encounter for screening for lipoid disorders: Secondary | ICD-10-CM | POA: Diagnosis not present

## 2018-05-15 DIAGNOSIS — E038 Other specified hypothyroidism: Secondary | ICD-10-CM

## 2018-05-15 DIAGNOSIS — I10 Essential (primary) hypertension: Secondary | ICD-10-CM

## 2018-05-15 DIAGNOSIS — Z6835 Body mass index (BMI) 35.0-35.9, adult: Secondary | ICD-10-CM

## 2018-05-15 DIAGNOSIS — Z0001 Encounter for general adult medical examination with abnormal findings: Secondary | ICD-10-CM

## 2018-05-15 DIAGNOSIS — Z8669 Personal history of other diseases of the nervous system and sense organs: Secondary | ICD-10-CM

## 2018-05-15 MED ORDER — KETOCONAZOLE 2 % EX CREA: 1.0000 "application " | TOPICAL_CREAM | Freq: Two times a day (BID) | CUTANEOUS | 0 refills | Status: DC

## 2018-05-15 MED ORDER — FLUOCINONIDE 0.05 % EX CREA: 1.0000 "application " | TOPICAL_CREAM | Freq: Two times a day (BID) | CUTANEOUS | 2 refills | Status: DC

## 2018-05-15 NOTE — Patient Instructions (Addendum)
INFORMATION ABOUT YOUR XRAY  Can walk into [address] for an Personal assistantxray. They will have the order and take you back. You do not any paper work, I should get the result back today or tomorrow. This order is good for a year.   GENERAL HEALTH GOALS  Know what a healthy weight is for you (roughly BMI <25) and aim to maintain this  Aim for 7+ servings of fruits and vegetables daily  70-80+ fluid ounces of water or unsweet tea for healthy kidneys  Limit to max 1 drink of alcohol per day; avoid smoking/tobacco  Limit animal fats in diet for cholesterol and heart health - choose grass fed whenever available  Avoid highly processed foods, and foods high in saturated/trans fats  Aim for low stress - take time to unwind and care for your mental health  Aim for 150 min of moderate intensity exercise weekly for heart health, and weights twice weekly for bone health  Aim for 7-9 hours of sleep daily   Can do zinc 40-50 mg a day Can try shake that is whey protein, almond milk, avocado oil, and spinach and strawberries in the morning  9 Ways to Naturally Increase Testosterone Levels  1.   Lose Weight If you're overweight, shedding the excess pounds may increase your testosterone levels, according to research presented at the Endocrine Society's 2012 meeting. Overweight men are more likely to have low testosterone levels to begin with, so this is an important trick to increase your body's testosterone production when you need it most.  2.   High-Intensity Exercise like Peak Fitness  Short intense exercise has a proven positive effect on increasing testosterone levels and preventing its decline. That's unlike aerobics or prolonged moderate exercise, which have shown to have negative or no effect on testosterone levels. Having a whey protein meal after exercise can further enhance the satiety/testosterone-boosting impact (hunger hormones cause the opposite effect on your testosterone and  libido). Here's a summary of what a typical high-intensity Peak Fitness routine might look like: " Warm up for three minutes  " Exercise as hard and fast as you can for 30 seconds. You should feel like you couldn't possibly go on another few seconds  " Recover at a slow to moderate pace for 90 seconds  " Repeat the high intensity exercise and recovery 7 more times .  3.   Consume Plenty of Zinc The mineral zinc is important for testosterone production, and supplementing your diet for as little as six weeks has been shown to cause a marked improvement in testosterone among men with low levels.1 Likewise, research has shown that restricting dietary sources of zinc leads to a significant decrease in testosterone, while zinc supplementation increases it2 -- and even protects men from exercised-induced reductions in testosterone levels.3 It's estimated that up to 45 percent of adults over the age of 60 may have lower than recommended zinc intakes; even when dietary supplements were added in, an estimated 20-25 percent of older adults still had inadequate zinc intakes, according to a [employer] and Nutrition Examination Survey.4 Your diet is the best source of zinc; along with protein-rich foods like meats and fish, other good dietary sources of zinc include raw milk, raw cheese, beans, and yogurt or kefir made from raw milk. It can be difficult to obtain enough dietary zinc if you're a vegetarian, and also for meat-eaters as well, largely because of conventional farming methods that rely heavily on chemical fertilizers and pesticides. These  chemicals deplete the soil of nutrients ... nutrients like zinc that must be absorbed by plants in order to be passed on to you. In many cases, you may further deplete the nutrients in your food by the way you prepare it. For most food, cooking it will drastically reduce its levels of nutrients like zinc ... particularly over-cooking, which many people do. If you  decide to use a zinc supplement, stick to a dosage of less than 40 mg a day, as this is the recommended adult upper limit. Taking too much zinc can interfere with your body's ability to absorb other minerals, especially copper, and may cause nausea as a side effect.  4.   Strength Training In addition to Peak Fitness, strength training is also known to boost testosterone levels, provided you are doing so intensely enough. When strength training to boost testosterone, you'll want to increase the weight and lower your number of reps, and then focus on exercises that work a large number of muscles, such as dead lifts or squats.  You can "turbo-charge" your weight training by going slower. By slowing down your movement, you're actually turning it into a high-intensity exercise. Super Slow movement allows your muscle, at the microscopic level, to access the maximum number of cross-bridges between the protein filaments that produce movement in the muscle.   5.   Optimize Your Vitamin D Levels Vitamin D, a steroid hormone, is essential for the healthy development of the nucleus of the sperm cell, and helps maintain semen quality and sperm count. Vitamin D also increases levels of testosterone, which may boost libido. In one study, overweight men who were given vitamin D supplements had a significant increase in testosterone levels after one year.5   6.   Reduce Stress When you're under a lot of stress, your body releases high levels of the stress hormone cortisol. This hormone actually blocks the effects of testosterone,6 presumably because, from a biological standpoint, testosterone-associated behaviors (mating, competing, aggression) may have lowered your chances of survival in an emergency (hence, the "fight or flight" response is dominant, courtesy of cortisol).  7.   Limit or Eliminate Sugar from Your Diet Testosterone levels decrease after you eat sugar, which is likely because the sugar leads to a high  insulin level, another factor leading to low testosterone.7 Based on USDA estimates, the average American consumes 12 teaspoons of sugar a day, which equates to about TWO TONS of sugar during a lifetime.  8.   Eat Healthy Fats By healthy, this means not only mon- and polyunsaturated fats, like that found in avocadoes and nuts, but also saturated, as these are essential for building testosterone. Research shows that a diet with less than 40 percent of energy as fat (and that mainly from animal sources, i.e. saturated) lead to a decrease in testosterone levels.8 My personal diet is about 60-70 percent healthy fat, and other experts agree that the ideal diet includes somewhere between 50-70 percent fat.  It's important to understand that your body requires saturated fats from animal and vegetable sources (such as meat, dairy, certain oils, and tropical plants like coconut) for optimal functioning, and if you neglect this important food group in favor of sugar, grains and other starchy carbs, your health and weight are almost guaranteed to suffer. Examples of healthy fats you can eat more of to give your testosterone levels a boost include: Olives and Olive oil  Coconuts and coconut oil Butter made from raw grass-fed organic milk Raw nuts, such  as, almonds or pecans Organic pastured egg yolks Avocados Grass-fed meats Palm oil Unheated organic nut oils   9.   Boost Your Intake of Branch Chain Amino Acids (BCAA) from Foods Like Whey Protein Research suggests that BCAAs result in higher testosterone levels, particularly when taken along with resistance training.9 While BCAAs are available in supplement form, you'll find the highest concentrations of BCAAs like leucine in dairy products - especially quality cheeses and whey protein. Even when getting leucine from your natural food supply, it's often wasted or used as a building block instead of an anabolic agent. So to create the correct anabolic environment,  you need to boost leucine consumption way beyond mere maintenance levels. That said, keep in mind that using leucine as a free form amino acid can be highly counterproductive as when free form amino acids are artificially administrated, they rapidly enter your circulation while disrupting insulin function, and impairing your body's glycemic control. Food-based leucine is really the ideal form that can benefit your muscles without side effects.

## 2018-05-16 LAB — LIPID PANEL
Cholesterol: 193 mg/dL (ref <200)
HDL: 35 mg/dL — ABNORMAL LOW (ref >=40)
LDL Cholesterol (Calc): 124 mg/dL (calc) — ABNORMAL HIGH
Non-HDL Cholesterol (Calc): 158 mg/dL (calc) — ABNORMAL HIGH (ref <130)
Total CHOL/HDL Ratio: 5.5 (calc) — ABNORMAL HIGH (ref <5.0)
Triglycerides: 227 mg/dL — ABNORMAL HIGH (ref <150)

## 2018-05-16 LAB — URINALYSIS, ROUTINE W REFLEX MICROSCOPIC
Bacteria, UA: NONE SEEN /HPF
Bilirubin Urine: NEGATIVE
Glucose, UA: NEGATIVE
Hgb urine dipstick: NEGATIVE
Hyaline Cast: NONE SEEN /LPF
Ketones, ur: NEGATIVE
Nitrite: NEGATIVE
Protein, ur: NEGATIVE
Specific Gravity, Urine: 1.025 (ref 1.001–1.03)
Squamous Epithelial / HPF: NONE SEEN /HPF (ref <=5)

## 2018-05-16 LAB — MICROALBUMIN / CREATININE URINE RATIO
Creatinine, Urine: 168 mg/dL (ref 20–320)
MICROALB UR: 2.8 mg/dL
Microalb Creat Ratio: 17 mcg/mg creat (ref <30)

## 2018-05-16 LAB — VITAMIN D 25 HYDROXY (VIT D DEFICIENCY, FRACTURES): Vit D, 25-Hydroxy: 28 ng/mL — ABNORMAL LOW (ref 30–100)

## 2018-05-16 LAB — TSH: TSH: 1.68 mIU/L (ref 0.40–4.50)

## 2018-06-06 ENCOUNTER — Other Ambulatory Visit: Payer: Self-pay | Admitting: Physician Assistant

## 2018-08-14 NOTE — Progress Notes (Signed)
Follow up  Assessment and Plan:  Essential hypertension -     TSH - continue medications, DASH diet, exercise and monitor at home. Call if greater than 130/80.   Class 2 severe obesity due to excess calories with serious comorbidity and body mass index (BMI) of 35.0 to 35.9 in adult (HCC) -     Insulin, random -     zinc gluconate 50 MG tablet; Take 1 tablet (50 mg total) by mouth daily. -     phentermine (ADIPEX-P) 37.5 MG tablet; Take 1 tablet (37.5 mg total) by mouth daily before breakfast. ? Would benefit from metformin  Hypothyroidism due to Hashimoto's thyroiditis -     TSH  Smoker Smoking cessation-  instruction/counseling given, counseled patient on the dangers of tobacco use, advised patient to stop smoking, and reviewed strategies to maximize success, patient not ready to quit at this time.   Vitamin D deficiency -     Vitamin D, Ergocalciferol, (DRISDOL) 1.25 MG (50000 UT) CAPS capsule; TAKE 1 CAPSULE BY MOUTH EVERY WEEK  Hypercholesteremia -     Lipid panel  B12 deficiency -     Cyanocobalamin (B-12) 500 MCG SUBL; One daily    Discussed med's effects and SE's. Screening labs and tests as requested with regular follow-up as recommended. Over 30 minutes of exam, counseling, chart review and critical decision making was performed  HPI Patient presents for follow up.   His blood pressure has been controlled at home, started on lisinopril 40 mg last visit, has had BP x teens, today their BP is BP: 140/76.   He works 3rd shift. He does not snore, does not stop breathing per partner. He states that he is very tired all the time still, he wants to eat all the time. He states may be worse after eating carbs. He also complains of his libido being obsolete. He has not mastrubated in 1 month, he will still have morning erections but does not achieve full erection. States he wants to want to have intercourse. He use to be on medications for anxiety/depression, states he was on a  handful, but he felt he was disconnected and did not like it. He did not tolerate prozac. He states currently he feels more anxiety.   BMI is Body mass index is 36.39 kg/m., he is working on diet and exercise. Wt Readings from Last 3 Encounters:  08/17/18 250 lb (113.4 kg)  05/15/18 242 lb 9.6 oz (110 kg)  04/13/18 243 lb (110.2 kg)   States he gained 80 lbs in 2 years, when he was a lower weight he states he had his sugars drop.  Lab Results  Component Value Date   HGBA1C 5.4 04/13/2018   B12 was low last visit, added supplement BUT HAS NOT BEEN TAKEN REGULARLY Lab Results  Component Value Date   VITAMINB12 322 04/13/2018   He was started on thyroid medication last visit. He has history of low thyroid with  Lab Results  Component Value Date   TSH 1.68 05/15/2018  .  He has history of skin issues, told it is eczema, has improved.   He does not workout. He denies chest pain, shortness of breath, dizziness.   Lab Results  Component Value Date   TESTOSTERONE 256 04/13/2018   The cholesterol last visit was VERY elevated putting him on risk for pancreatitis,HE IS ON FENOFIBRATE Lab Results  Component Value Date   CHOL 193 05/15/2018   HDL 35 (L) 05/15/2018  LDLCALC 124 (H) 05/15/2018   TRIG 227 (H) 05/15/2018   CHOLHDL 5.5 (H) 05/15/2018   He is on 1 pill a week of vitamin D, will recheck today as well.  Vit D, 25-Hydroxy  Date Value Ref Range Status  05/15/2018 28 (L) 30 - 100 ng/mL Final    Comment:    Vitamin D Status         25-OH Vitamin D: . Deficiency:                    <20 ng/mL Insufficiency:             20 - 29 ng/mL Optimal:                 > or = 30 ng/mL . For 25-OH Vitamin D testing on patients on  D2-supplementation and patients for whom quantitation  of D2 and D3 fractions is required, the Quest[facility]ureD(TM) 25-OH VIT D, (D2,D3), LC/MS/MS is recommended: order  code 16109 (patients >52yrs). . For more information on this test, go  to: http://education.questdiagnostics.com/faq/FAQ163 (This link is being provided for  informational/educational purposes only.)     Current Medications:  Current Outpatient Medications on File Prior to Visit  Medication Sig Dispense Refill  . fenofibrate (TRICOR) 145 MG tablet Take 1 tablet (145 mg total) by mouth daily. 90 tablet 1  . fluocinonide cream (LIDEX) 0.05 % Apply 1 application topically 2 (two) times daily. 60 g 2  . ketoconazole (NIZORAL) 2 % cream Apply 1 application topically 2 (two) times daily. 15 g 0  . levothyroxine (SYNTHROID) 75 MCG tablet Take 1 tablet (75 mcg total) by mouth daily. 90 tablet 1  . lisinopril (PRINIVIL,ZESTRIL) 40 MG tablet Take 1 tablet (40 mg total) by mouth daily. 90 tablet 1  . Vitamin D, Ergocalciferol, (DRISDOL) 1.25 MG (50000 UT) CAPS capsule TAKE 1 CAPSULE BY MOUTH EVERY WEEK 8 capsule 0   No current facility-administered medications on file prior to visit.    Allergies:  No Known Allergies    Medical History:  has Hypertension; Hypothyroidism; Smoker; Class 2 severe obesity due to excess calories with serious comorbidity and body mass index (BMI) of 35.0 to 35.9 in adult [facility]); Vitamin D deficiency; Eczema; History of migraine; and Hypercholesteremia on their problem list. Surgical History:  He  has no past surgical history on file. Family History:  His family history includes AAA (abdominal aortic aneurysm) in his maternal grandmother; Aneurysm in his father; Cancer in his maternal grandfather and mother; Crohn's disease in his sister; Diabetes in his paternal aunt and paternal uncle; Hypertension in his father and sister; Stroke in his father, maternal grandmother, and paternal grandfather. Social History:   reports that he quit smoking about 5 years ago. His smoking use included cigarettes. He has a 20.00 pack-year smoking history. He has never used smokeless tobacco. He reports previous alcohol use. He reports that he does not use  drugs.   Review of Systems:  Review of Systems  Constitutional: Positive for malaise/fatigue. Negative for chills, diaphoresis, fever and weight loss.  HENT: Negative.   Eyes: Negative.   Respiratory: Negative.   Cardiovascular: Negative.   Gastrointestinal: Negative.   Genitourinary: Negative.   Musculoskeletal: Negative.   Skin: Negative.     Physical Exam: Estimated body mass index is 36.39 kg/m as calculated from the following:   Height as of this encounter: 5' 9.5" (1.765 m).   Weight as of this encounter: 250 lb (113.4 kg). BP  140/76   Pulse (!) 103   Temp 97.7 F (36.5 C)   Ht 5' 9.5" (1.765 m)   Wt 250 lb (113.4 kg)   SpO2 98%   BMI 36.39 kg/m  General Appearance: Well nourished, in no apparent distress.  Eyes: PERRLA, EOMs, conjunctiva no swelling or erythema, normal fundi and vessels.  Sinuses: No Frontal/maxillary tenderness  ENT/Mouth: Ext aud canals clear, normal light reflex with TMs without erythema, bulging. Good dentition. No erythema, swelling, or exudate on post pharynx. Tonsils not swollen or erythematous. Hearing normal.  Neck: Supple, thyroid normal. No bruits  Respiratory: Respiratory effort normal, BS equal bilaterally without rales, rhonchi, wheezing or stridor.  Cardio: RRR without murmurs, rubs or gallops. Brisk peripheral pulses without edema.  Chest: symmetric, with normal excursions and percussion.  Abdomen: Soft, nontender, no guarding, rebound, hernias, masses, or organomegaly.  Lymphatics: Non tender without lymphadenopathy.  Musculoskeletal: Full ROM all peripheral extremities,5/5 strength, and normal gait.  Skin: Warm, dry without rashes, lesions, ecchymosis. Neuro: Cranial nerves intact, reflexes equal bilaterally. Normal muscle tone, no cerebellar symptoms. Sensation intact.  Psych: Awake and oriented X 3, normal affect, Insight and Judgment appropriate.    Quentin MullingAmanda Kurtis Anastasia 4:16 PM [facility]

## 2018-08-17 ENCOUNTER — Ambulatory Visit (INDEPENDENT_AMBULATORY_CARE_PROVIDER_SITE_OTHER): Payer: Self-pay | Admitting: Physician Assistant

## 2018-08-17 ENCOUNTER — Encounter: Payer: Self-pay | Admitting: Physician Assistant

## 2018-08-17 ENCOUNTER — Other Ambulatory Visit: Payer: Self-pay

## 2018-08-17 VITALS — BP 140/76 | HR 103 | Temp 97.7°F | Ht 69.5 in | Wt 250.0 lb

## 2018-08-17 DIAGNOSIS — E538 Deficiency of other specified B group vitamins: Secondary | ICD-10-CM

## 2018-08-17 DIAGNOSIS — E559 Vitamin D deficiency, unspecified: Secondary | ICD-10-CM

## 2018-08-17 DIAGNOSIS — E038 Other specified hypothyroidism: Secondary | ICD-10-CM

## 2018-08-17 DIAGNOSIS — I10 Essential (primary) hypertension: Secondary | ICD-10-CM

## 2018-08-17 DIAGNOSIS — Z6835 Body mass index (BMI) 35.0-35.9, adult: Secondary | ICD-10-CM

## 2018-08-17 DIAGNOSIS — F17200 Nicotine dependence, unspecified, uncomplicated: Secondary | ICD-10-CM

## 2018-08-17 DIAGNOSIS — E611 Iron deficiency: Secondary | ICD-10-CM

## 2018-08-17 DIAGNOSIS — E7800 Pure hypercholesterolemia, unspecified: Secondary | ICD-10-CM

## 2018-08-17 DIAGNOSIS — E063 Autoimmune thyroiditis: Secondary | ICD-10-CM

## 2018-08-17 MED ORDER — PHENTERMINE HCL 37.5 MG PO TABS: 37.5000 mg | ORAL_TABLET | Freq: Every day | ORAL | 2 refills | Status: DC

## 2018-08-17 MED ORDER — VITAMIN D (ERGOCALCIFEROL) 1.25 MG (50000 UNIT) PO CAPS: ORAL_CAPSULE | ORAL | 3 refills | Status: DC

## 2018-08-17 MED ORDER — B-12 500 MCG SL SUBL: SUBLINGUAL_TABLET | SUBLINGUAL | 3 refills | Status: AC

## 2018-08-17 MED ORDER — ZINC GLUCONATE 50 MG PO TABS: 50.0000 mg | ORAL_TABLET | Freq: Every day | ORAL | Status: AC

## 2018-08-17 NOTE — Patient Instructions (Addendum)
MAKE SURE ON B12 SUBLINGUAL MAKE SURE ON ZINC 50 MG  We are starting you on Metformin to prevent or treat diabetes.  Metformin does NOT cause low blood sugars.   In order to create energy your cells need insulin and sugar but sometime your cells do not accept the insulin and this can cause increased sugars and decreased energy.   The Metformin helps your cells accept insulin and the sugar this help: 1) increase your energy  2) weight loss.    The two most common side effects are nausea and diarrhea, follow these rules to avoid it but these symptoms get better with time on the medication.    ALSO You can take imodium per box instructions when starting metformin if needed.   Rules of metformin: 1) start out slow with only one pill daily. Our goal for you is 4 pills a day or 2000mg  total.  2) take with your largest meal. 3) Take with least amount of carbs.   Phentermine  While taking the medication we may ask that you come into the office once a month for the first month for a blood pressure check and EKG.   Also please bring in a food log for that visit to review. It is helpful if you bring in a food diary or use an app on your phone such as myfitnesspal to record your calorie intake, especially in the beginning. BRING FOR YOUR FIRST VISIT.  After that first initial visit, we will want to see you once every 2-3 months to monitor your weight, blood pressure, and heart rate.   In addition we can help answer your questions about diet, exercise, and help you every step of the way with your weight loss journey.  You can start out on 1/3 to 1/2 a pill in the morning and if you are tolerating it well you can increase to one pill daily. I also have some patients that take 1/3 or 1/2 at lunch to help prevent night time eating.  This medication is cheapest [facility] at [facility] [employer] [facility] is 14-17 dollars and you do NOT need a membership to get meds from there.   It causes dry mouth  and constipation in almost every patient, so try to get 80-100 oz of water a day and increase fiber such as veggies. You can add on a stool softener if you would like.   It can give you energy however it can also cause some people to be shaky, anxious or have palpitations. Stop this medication if that happens and contact the office.   If this medication does not work for you there are several medications that we can try to help rewire your brain in addition to making healthier habits.   What is this medicine? PHENTERMINE (FEN ter meen) decreases your appetite. This medicine is intended to be used in addition to a healthy reduced calorie diet and exercise. The best results are achieved this way. This medicine is only indicated for short-term use. Eventually your weight loss may level out and the medication will no longer be needed.   How should I use this medicine? Take this medicine by mouth. Follow the directions on the prescription label. The tablets should stay in the bottle until immediately before you take your dose. Take your doses at regular intervals. Do not take your medicine more often than directed.  Overdosage: If you think you have taken too much of this medicine contact a poison control  center or emergency room at once. NOTE: This medicine is only for you. Do not share this medicine with others.  What if I miss a dose? If you miss a dose, take it as soon as you can. If it is almost time for your next dose, take only that dose. Do not take double or extra doses. Do not increase or in any way change your dose without consulting your doctor.  What should I watch for while using this medicine? Notify your physician immediately if you become short of breath while doing your normal activities. Do not take this medicine within 6 hours of bedtime. It can keep you from getting to sleep. Avoid drinks that contain caffeine and try to stick to a regular bedtime every night. Do not stand or sit  up quickly, especially if you are an older patient. This reduces the risk of dizzy or fainting spells. Avoid alcoholic drinks.  What side effects may I notice from receiving this medicine? Side effects that you should report to your doctor or health care professional as soon as possible: -chest pain, palpitations -depression or severe changes in mood -increased blood pressure -irritability -nervousness or restlessness -severe dizziness -shortness of breath -problems urinating -unusual swelling of the legs -vomiting  Side effects that usually do not require medical attention (report to your doctor or health care professional if they continue or are bothersome): -blurred vision or other eye problems -changes in sexual ability or desire -constipation or diarrhea -difficulty sleeping -dry mouth or unpleasant taste -headache -nausea This list may not describe all possible side effects. Call your doctor for medical advice about side effects. You may report side effects to FDA at 1-800-FDA-1088.   Call if you have any problems.    Insulin Resistance  Insulin is a hormone that helps to control blood sugar (glucose) levels in the body. It is made in the pancreas. Insulin allows glucose to enter cells in the body. Insulin sensitivity refers to how the body responds to insulin. Insulin resistance occurs when cells in the body do not respond properly to insulin made by the pancreas and are not able to absorb glucose from the bloodstream. Insulin resistance results in high blood glucose levels (hyperglycemia) and can lead to problems, including:  Prediabetes.  Type 2 diabetes (type 2 diabetes mellitus).  Heart disease.  High blood pressure (hypertension).  Stroke.  Polycystic ovary syndrome (PCOS).  Nonalcoholic fatty liver disease. What are the causes? The exact cause of insulin resistance is not known. What increases the risk? The following factors may make you more likely to  develop insulin resistance:  Being overweight or obese, especially if a lot of your weight is in your waist area.  Having an inactive (sedentary) lifestyle.  Using steroids.  Being older than age 98.  Having sleep apnea.  Using tobacco products. What are the signs or symptoms? This condition usually does not cause symptoms. How is this diagnosed? There is no test to diagnose insulin resistance. However, your health care provider may diagnose insulin resistance based on:  Your blood glucose levels.  Your cholesterol levels.  A measurement of the distance around your waist (circumference). A waist circumference of more than 35 inches (88.9 cm) for women and more than 40 inches (101.6 cm) for men may be a sign of insulin resistance.  Your risk factors.  A physical exam.  Your medical history. How is this treated? Insulin resistance is treated with nutrition and lifestyle changes. These changes may include:  Eating a healthy balance of nutritious foods.  Getting more physical activity.  Maintaining a healthy weight.  Stopping the use of any tobacco products. Your health care provider will work with you to change your nutrition and lifestyle as needed. In some cases, treatment may also include medicine to improve your insulin sensitivity. Follow these instructions at home: Activity  Be physically active. Do moderate-intensity physical activity for 30 minutes or more on 5 or more days of the week, or as much as told by your health care provider. This could be brisk walking, biking, or water aerobics.  Ask your health care provider what activities are safe for you. A mix of physical activities may be best, such as walking, swimming, cycling, and strength training. Eating and drinking   Follow a healthy meal plan. This includes eating lean proteins, complex carbohydrates, fresh fruits and vegetables, low-fat dairy products, and healthy fats.  Follow instructions from your  health care provider about eating or drinking restrictions.  Make an appointment to see a diet and nutrition specialist (registered dietitian) to help you create a healthy eating plan. General instructions  Check your blood glucose levels as told by your health care provider.  Take over-the-counter and prescription medicines only as told by your health care provider.  Lose weight as told by your health care provider. ? Losing 5-7% of your body weight can reverse insulin resistance. ? Your health care provider can determine how much weight loss is best for you and can help you lose weight safely.  Do not use any tobacco products, such as cigarettes, chewing tobacco, and e-cigarettes. If you need help quitting, ask your health care provider.  Keep all follow-up visits as told by your health care provider. This is important. Contact a health care provider if:  You have trouble losing weight or maintaining your goal weight.  You gain weight.  You have trouble following your prescribed meal plan.  You have trouble exercising more. Summary  Insulin resistance occurs when cells in the body do not respond properly to insulin made by the pancreas and are not able to absorb blood sugar (glucose) from the bloodstream.  Your health care provider will work with you to change your nutrition and lifestyle as needed. Treatment may also include medicine to improve your insulin sensitivity.  Keep all follow-up visits as told by your health care provider. This is important. This information is not intended to replace advice given to you by your health care provider. Make sure you discuss any questions you have with your health care provider. Document Released: 04/16/2005 Document Revised: 10/15/2016 Document Reviewed: 03/31/2015 Elsevier Interactive Patient Education  2019 [employer].  Can do zinc 40-50 mg a day Can try shake that is whey protein, almond milk, avocado oil, and spinach and  strawberries in the morning  9 Ways to Naturally Increase Testosterone Levels  1.   Lose Weight If you're overweight, shedding the excess pounds may increase your testosterone levels, according to research presented at the Endocrine Society's 2012 meeting. Overweight men are more likely to have low testosterone levels to begin with, so this is an important trick to increase your body's testosterone production when you need it most.  2.   High-Intensity Exercise like Peak Fitness  Short intense exercise has a proven positive effect on increasing testosterone levels and preventing its decline. That's unlike aerobics or prolonged moderate exercise, which have shown to have negative or no effect on testosterone levels. Having a whey protein meal  after exercise can further enhance the satiety/testosterone-boosting impact (hunger hormones cause the opposite effect on your testosterone and libido). Here's a summary of what a typical high-intensity Peak Fitness routine might look like: " Warm up for three minutes  " Exercise as hard and fast as you can for 30 seconds. You should feel like you couldn't possibly go on another few seconds  " Recover at a slow to moderate pace for 90 seconds  " Repeat the high intensity exercise and recovery 7 more times .  3.   Consume Plenty of Zinc The mineral zinc is important for testosterone production, and supplementing your diet for as little as six weeks has been shown to cause a marked improvement in testosterone among men with low levels.1 Likewise, research has shown that restricting dietary sources of zinc leads to a significant decrease in testosterone, while zinc supplementation increases it2 -- and even protects men from exercised-induced reductions in testosterone levels.3 It's estimated that up to 45 percent of adults over the age of 60 may have lower than recommended zinc intakes; even when dietary supplements were added in, an estimated 20-25 percent of  older adults still had inadequate zinc intakes, according to a [employer] and Nutrition Examination Survey.4 Your diet is the best source of zinc; along with protein-rich foods like meats and fish, other good dietary sources of zinc include raw milk, raw cheese, beans, and yogurt or kefir made from raw milk. It can be difficult to obtain enough dietary zinc if you're a vegetarian, and also for meat-eaters as well, largely because of conventional farming methods that rely heavily on chemical fertilizers and pesticides. These chemicals deplete the soil of nutrients ... nutrients like zinc that must be absorbed by plants in order to be passed on to you. In many cases, you may further deplete the nutrients in your food by the way you prepare it. For most food, cooking it will drastically reduce its levels of nutrients like zinc ... particularly over-cooking, which many people do. If you decide to use a zinc supplement, stick to a dosage of less than 40 mg a day, as this is the recommended adult upper limit. Taking too much zinc can interfere with your body's ability to absorb other minerals, especially copper, and may cause nausea as a side effect.  4.   Strength Training In addition to Peak Fitness, strength training is also known to boost testosterone levels, provided you are doing so intensely enough. When strength training to boost testosterone, you'll want to increase the weight and lower your number of reps, and then focus on exercises that work a large number of muscles, such as dead lifts or squats.  You can "turbo-charge" your weight training by going slower. By slowing down your movement, you're actually turning it into a high-intensity exercise. Super Slow movement allows your muscle, at the microscopic level, to access the maximum number of cross-bridges between the protein filaments that produce movement in the muscle.   5.   Optimize Your Vitamin D Levels Vitamin D, a steroid hormone, is  essential for the healthy development of the nucleus of the sperm cell, and helps maintain semen quality and sperm count. Vitamin D also increases levels of testosterone, which may boost libido. In one study, overweight men who were given vitamin D supplements had a significant increase in testosterone levels after one year.5   6.   Reduce Stress When you're under a lot of stress, your body releases high levels of the  stress hormone cortisol. This hormone actually blocks the effects of testosterone,6 presumably because, from a biological standpoint, testosterone-associated behaviors (mating, competing, aggression) may have lowered your chances of survival in an emergency (hence, the "fight or flight" response is dominant, courtesy of cortisol).  7.   Limit or Eliminate Sugar from Your Diet Testosterone levels decrease after you eat sugar, which is likely because the sugar leads to a high insulin level, another factor leading to low testosterone.7 Based on USDA estimates, the average American consumes 12 teaspoons of sugar a day, which equates to about TWO TONS of sugar during a lifetime.  8.   Eat Healthy Fats By healthy, this means not only mon- and polyunsaturated fats, like that found in avocadoes and nuts, but also saturated, as these are essential for building testosterone. Research shows that a diet with less than 40 percent of energy as fat (and that mainly from animal sources, i.e. saturated) lead to a decrease in testosterone levels.8 My personal diet is about 60-70 percent healthy fat, and other experts agree that the ideal diet includes somewhere between 50-70 percent fat.  It's important to understand that your body requires saturated fats from animal and vegetable sources (such as meat, dairy, certain oils, and tropical plants like coconut) for optimal functioning, and if you neglect this important food group in favor of sugar, grains and other starchy carbs, your health and weight are  almost guaranteed to suffer. Examples of healthy fats you can eat more of to give your testosterone levels a boost include: Olives and Olive oil  Coconuts and coconut oil Butter made from raw grass-fed organic milk Raw nuts, such as, almonds or pecans Organic pastured egg yolks Avocados Grass-fed meats Palm oil Unheated organic nut oils   9.   Boost Your Intake of Branch Chain Amino Acids (BCAA) from Foods Like Whey Protein Research suggests that BCAAs result in higher testosterone levels, particularly when taken along with resistance training.9 While BCAAs are available in supplement form, you'll find the highest concentrations of BCAAs like leucine in dairy products - especially quality cheeses and whey protein. Even when getting leucine from your natural food supply, it's often wasted or used as a building block instead of an anabolic agent. So to create the correct anabolic environment, you need to boost leucine consumption way beyond mere maintenance levels. That said, keep in mind that using leucine as a free form amino acid can be highly counterproductive as when free form amino acids are artificially administrated, they rapidly enter your circulation while disrupting insulin function, and impairing your body's glycemic control. Food-based leucine is really the ideal form that can benefit your muscles without side effects.

## 2018-08-18 ENCOUNTER — Other Ambulatory Visit: Payer: Self-pay | Admitting: Physician Assistant

## 2018-08-18 LAB — TSH: TSH: 2.02 mIU/L (ref 0.40–4.50)

## 2018-08-18 LAB — LIPID PANEL
Cholesterol: 185 mg/dL (ref <200)
HDL: 30 mg/dL — ABNORMAL LOW (ref >=40)
LDL Cholesterol (Calc): 114 mg/dL (calc) — ABNORMAL HIGH
Non-HDL Cholesterol (Calc): 155 mg/dL (calc) — ABNORMAL HIGH (ref <130)
Total CHOL/HDL Ratio: 6.2 (calc) — ABNORMAL HIGH (ref <5.0)
Triglycerides: 287 mg/dL — ABNORMAL HIGH (ref <150)

## 2018-08-18 LAB — INSULIN, RANDOM: Insulin: 34.9 u[IU]/mL — ABNORMAL HIGH

## 2018-08-18 MED ORDER — METFORMIN HCL ER 500 MG PO TB24: ORAL_TABLET | ORAL | 1 refills | Status: DC

## 2018-10-13 ENCOUNTER — Other Ambulatory Visit: Payer: Self-pay | Admitting: Physician Assistant

## 2018-10-13 DIAGNOSIS — I10 Essential (primary) hypertension: Secondary | ICD-10-CM

## 2018-12-04 ENCOUNTER — Other Ambulatory Visit: Payer: Self-pay | Admitting: Physician Assistant

## 2018-12-13 DIAGNOSIS — E559 Vitamin D deficiency, unspecified: Secondary | ICD-10-CM

## 2018-12-13 DIAGNOSIS — I10 Essential (primary) hypertension: Secondary | ICD-10-CM

## 2018-12-14 MED ORDER — FENOFIBRATE 145 MG PO TABS: ORAL_TABLET | ORAL | 0 refills | Status: DC

## 2018-12-14 MED ORDER — LISINOPRIL 40 MG PO TABS: ORAL_TABLET | ORAL | 0 refills | Status: DC

## 2018-12-14 MED ORDER — VITAMIN D (ERGOCALCIFEROL) 1.25 MG (50000 UNIT) PO CAPS: ORAL_CAPSULE | ORAL | 3 refills | Status: DC

## 2018-12-14 MED ORDER — LEVOTHYROXINE SODIUM 75 MCG PO TABS: ORAL_TABLET | ORAL | 0 refills | Status: DC

## 2018-12-14 MED ORDER — METFORMIN HCL ER 500 MG PO TB24: ORAL_TABLET | ORAL | 1 refills | Status: DC

## 2018-12-23 ENCOUNTER — Other Ambulatory Visit: Payer: Self-pay | Admitting: Physician Assistant

## 2018-12-23 MED ORDER — METFORMIN HCL ER 750 MG PO TB24: 750.0000 mg | ORAL_TABLET | Freq: Two times a day (BID) | ORAL | 3 refills | Status: DC

## 2018-12-29 ENCOUNTER — Other Ambulatory Visit: Payer: Self-pay | Admitting: Physician Assistant

## 2018-12-29 MED ORDER — METFORMIN HCL ER 750 MG PO TB24: 750.0000 mg | ORAL_TABLET | Freq: Two times a day (BID) | ORAL | 3 refills | Status: DC

## 2019-01-08 ENCOUNTER — Other Ambulatory Visit: Payer: Self-pay | Admitting: Internal Medicine

## 2019-01-08 DIAGNOSIS — E038 Other specified hypothyroidism: Secondary | ICD-10-CM

## 2019-01-08 DIAGNOSIS — I10 Essential (primary) hypertension: Secondary | ICD-10-CM

## 2019-01-08 DIAGNOSIS — E063 Autoimmune thyroiditis: Secondary | ICD-10-CM

## 2019-01-15 ENCOUNTER — Other Ambulatory Visit: Payer: Self-pay | Admitting: Internal Medicine

## 2019-01-15 DIAGNOSIS — I10 Essential (primary) hypertension: Secondary | ICD-10-CM

## 2019-01-15 DIAGNOSIS — E038 Other specified hypothyroidism: Secondary | ICD-10-CM

## 2019-01-15 MED ORDER — LEVOTHYROXINE SODIUM 75 MCG PO TABS: ORAL_TABLET | ORAL | 0 refills | Status: DC

## 2019-01-15 MED ORDER — LISINOPRIL 40 MG PO TABS: ORAL_TABLET | ORAL | 0 refills | Status: DC

## 2019-02-10 ENCOUNTER — Other Ambulatory Visit: Payer: Self-pay | Admitting: Physician Assistant

## 2019-02-10 DIAGNOSIS — Z6835 Body mass index (BMI) 35.0-35.9, adult: Secondary | ICD-10-CM

## 2019-04-21 ENCOUNTER — Other Ambulatory Visit: Payer: Self-pay | Admitting: Physician Assistant

## 2019-04-21 ENCOUNTER — Other Ambulatory Visit: Payer: Self-pay

## 2019-04-22 NOTE — Telephone Encounter (Signed)
Recent Visits Date Type Provider Dept  08/17/18 Office Visit Quentin Mulling, PA-C [facility]  05/15/18 Office Visit Quentin Mulling, PA-C [facility]  04/13/18 Office Visit Quentin Mulling, PA-C [facility]  Showing recent visits within past 540 days with a meds authorizing provider and meeting all other requirements   Future Appointments Date Type Provider Dept  05/19/19 Appointment Quentin Mulling, PA-C [facility]  Showing future appointments within next 150 days with a meds authorizing provider and meeting all other requirements

## 2019-05-19 ENCOUNTER — Encounter: Payer: Self-pay | Admitting: Physician Assistant

## 2019-05-19 ENCOUNTER — Other Ambulatory Visit: Payer: Self-pay

## 2019-06-15 ENCOUNTER — Other Ambulatory Visit: Payer: Self-pay | Admitting: Adult Health

## 2019-06-15 DIAGNOSIS — I10 Essential (primary) hypertension: Secondary | ICD-10-CM

## 2019-06-15 DIAGNOSIS — E063 Autoimmune thyroiditis: Secondary | ICD-10-CM

## 2019-06-15 DIAGNOSIS — E038 Other specified hypothyroidism: Secondary | ICD-10-CM

## 2019-06-15 MED ORDER — LEVOTHYROXINE SODIUM 75 MCG PO TABS: ORAL_TABLET | ORAL | 0 refills | Status: DC

## 2019-06-15 MED ORDER — LISINOPRIL 40 MG PO TABS: ORAL_TABLET | ORAL | 0 refills | Status: DC

## 2019-06-15 MED ORDER — METFORMIN HCL ER 750 MG PO TB24: 750.0000 mg | ORAL_TABLET | Freq: Two times a day (BID) | ORAL | 3 refills | Status: DC

## 2019-06-21 DIAGNOSIS — Z79899 Other long term (current) drug therapy: Secondary | ICD-10-CM | POA: Insufficient documentation

## 2019-06-21 DIAGNOSIS — E538 Deficiency of other specified B group vitamins: Secondary | ICD-10-CM | POA: Insufficient documentation

## 2019-06-21 NOTE — Progress Notes (Deleted)
CPE  Assessment and Plan:  Essential hypertension - continue medications, DASH diet, exercise and monitor at home. Call if greater than 130/80.  -     TSH -     Urinalysis, Routine w reflex microscopic -     Microalbumin / creatinine urine ratio -     EKG 12-Lead -     DG Chest 2 View; Future  Hypothyroidism due to Hashimoto's thyroiditis -     TSH Hypothyroidism-check TSH level, continue medications the same, reminded to take on an empty stomach 30-68mins before food.   Smoker -     DG Chest 2 View; Future Smoking cessation-  instruction/counseling given, counseled patient on the dangers of tobacco use, advised patient to stop smoking, and reviewed strategies to maximize success, patient not ready to quit at this time.   Medication management  Class 2 severe obesity due to excess calories with serious comorbidity and body mass index (BMI) of 35.0 to 35.9 in adult [facility])  Overweight  - long discussion about weight loss, diet, and exercise -recommended diet heavy in fruits and veggies and low in animal meats, cheeses, and dairy products  Encounter for general adult medical examination with abnormal findings 1 year  Vitamin D deficiency -     VITAMIN D 25 Hydroxy (Vit-D Deficiency, Fractures)  Eczema, unspecified type -     ketoconazole (NIZORAL) 2 % cream; Apply 1 application topically 2 (two) times daily. -     fluocinonide cream (LIDEX) 0.05 %; Apply 1 application topically 2 (two) times daily.  History of migraine Have been controlled, with family history did discussed possible MRA however declines due to cost.   Hypercholesteremia -     Lipid panel - on fenofirbate, TSH better controlled -? Add on statin versus vacepa for trigs   Discussed med's effects and SE's. Screening labs and tests as requested with regular follow-up as recommended. Over 40 minutes of exam, counseling, chart review and critical decision making was performed  HPI Patient presents for a  complete physical as a new patient.   His blood pressure has been controlled at home, started on lisinopril 40 mg last visit, has had BP x teens, today their BP is  .  He works 3rd shift. He does not snore, does not stop breathing per partner.   B12 was low last visit, added supplement Lab Results  Component Value Date   VITAMINB12 322 04/13/2018   He was started on thyroid medication last visit. He has history of low thyroid with  Lab Results  Component Value Date   TSH 2.02 08/17/2018  .  He has history of skin issues, told it is eczema, has improved.   He does not workout. He denies chest pain, shortness of breath, dizziness.   BMI is There is no height or weight on file to calculate BMI., he is working on diet and exercise. His testosterone was low normal last time.  Wt Readings from Last 3 Encounters:  08/17/18 250 lb (113.4 kg)  05/15/18 242 lb 9.6 oz (110 kg)  04/13/18 243 lb (110.2 kg)   Lab Results  Component Value Date   TESTOSTERONE 256 04/13/2018   The cholesterol last visit was VERY elevated putting him on risk for pancreatitis, need to recheck with thyroid correction, if not better will add on vasepa:   Lab Results  Component Value Date   CHOL 185 08/17/2018   HDL 30 (L) 08/17/2018   LDLCALC 114 (H) 08/17/2018   TRIG  287 (H) 08/17/2018   CHOLHDL 6.2 (H) 08/17/2018    Last A1C in the office was:  Lab Results  Component Value Date   HGBA1C 5.4 04/13/2018   He is on 1 pill a week of vitamin D, will recheck today as well.  Vit D, 25-Hydroxy  Date Value Ref Range Status  05/15/2018 28 (L) 30 - 100 ng/mL Final    Comment:    Vitamin D Status         25-OH Vitamin D: . Deficiency:                    <20 ng/mL Insufficiency:             20 - 29 ng/mL Optimal:                 > or = 30 ng/mL . For 25-OH Vitamin D testing on patients on  D2-supplementation and patients for whom quantitation  of D2 and D3 fractions is required, the  QuestAssureD(TM) 25-OH VIT D, (D2,D3), LC/MS/MS is recommended: order  code [phone] (patients >83yrs). . For more information on this test, go to: http://education.questdiagnostics.com/faq/FAQ163 (This link is being provided for  informational/educational purposes only.)     Current Medications:  Current Outpatient Medications on File Prior to Visit  Medication Sig Dispense Refill  . Cyanocobalamin (B-12) 500 MCG SUBL One daily 30 tablet 3  . fenofibrate (TRICOR) 145 MG tablet TAKE ONE TABLET BY MOUTH ONE TIME DAILY  for triglycerides 90 tablet 0  . fluocinonide cream (LIDEX) 2.53 % Apply 1 application topically 2 (two) times daily. 60 g 2  . ketoconazole (NIZORAL) 2 % cream Apply 1 application topically 2 (two) times daily. 15 g 0  . levothyroxine (SYNTHROID) 75 MCG tablet Take 1 tablet daily on an empty stomach with only water for 30 minutes & no Antacid meds, Calcium or Magnesium for 4 hours & avoid Biotin 90 tablet 0  . lisinopril (ZESTRIL) 40 MG tablet Take 1 tablet Dauily for BP & Diabetic Kidney Protection 90 tablet 0  . metFORMIN (GLUCOPHAGE XR) 750 MG 24 hr tablet Take 1 tablet (750 mg total) by mouth 2 (two) times daily with a meal. 60 tablet 3  . phentermine (ADIPEX-P) 37.5 MG tablet Take 1 tablet (37.5 mg total) by mouth daily before breakfast. 30 tablet 2  . Vitamin D, Ergocalciferol, (DRISDOL) 1.25 MG (50000 UT) CAPS capsule TAKE 1 CAPSULE BY MOUTH EVERY WEEK 8 capsule 3  . zinc gluconate 50 MG tablet Take 1 tablet (50 mg total) by mouth daily.     No current facility-administered medications on file prior to visit.   Allergies:  No Known Allergies   Health Maintenance:  There is no immunization history for the selected administration types on file for this patient.  Tetanus: DUE but declines Pneumovax: Prevnar 13: Flu vaccine: DECLINES Zostavax:  DEXA: N/A Colonoscopy: due age 71 EGD:  Patient Care Team: Unk Pinto, MD as PCP - General (Internal  Medicine)  Medical History:  has Hypertension; Hypothyroidism; Smoker; Class 2 severe obesity due to excess calories with serious comorbidity and body mass index (BMI) of 35.0 to 35.9 in adult [facility]); Vitamin D deficiency; Eczema; History of migraine; and Hypercholesteremia on their problem list. Surgical History:  He  has no past surgical history on file. Family History:  His family history includes AAA (abdominal aortic aneurysm) in his maternal grandmother; Aneurysm in his father; Cancer in his maternal grandfather and mother; Crohn's disease  in his sister; Diabetes in his paternal aunt and paternal uncle; Hypertension in his father and sister; Stroke in his father, maternal grandmother, and paternal grandfather. Social History:   reports that he quit smoking about 6 years ago. His smoking use included cigarettes. He has a 20.00 pack-year smoking history. He has never used smokeless tobacco. He reports previous alcohol use. He reports that he does not use drugs.   Review of Systems:  Review of Systems  Constitutional: Positive for malaise/fatigue. Negative for chills, diaphoresis, fever and weight loss.  HENT: Negative.   Eyes: Negative.   Respiratory: Negative.   Cardiovascular: Negative.   Gastrointestinal: Negative.   Genitourinary: Negative.   Musculoskeletal: Negative.   Skin: Negative.     Physical Exam: Estimated body mass index is 36.39 kg/m as calculated from the following:   Height as of 08/17/18: 5' 9.5" (1.765 m).   Weight as of 08/17/18: 250 lb (113.4 kg). There were no vitals taken for this visit. General Appearance: Well nourished, in no apparent distress.  Eyes: PERRLA, EOMs, conjunctiva no swelling or erythema, normal fundi and vessels.  Sinuses: No Frontal/maxillary tenderness  ENT/Mouth: Ext aud canals clear, normal light reflex with TMs without erythema, bulging. Good dentition. No erythema, swelling, or exudate on post pharynx. Tonsils not swollen or erythematous.  Hearing normal.  Neck: Supple, thyroid normal. No bruits  Respiratory: Respiratory effort normal, BS equal bilaterally without rales, rhonchi, wheezing or stridor.  Cardio: RRR without murmurs, rubs or gallops. Brisk peripheral pulses without edema.  Chest: symmetric, with normal excursions and percussion.  Abdomen: Soft, nontender, no guarding, rebound, hernias, masses, or organomegaly.  Lymphatics: Non tender without lymphadenopathy.  Musculoskeletal: Full ROM all peripheral extremities,5/5 strength, and normal gait.  Skin: Warm, dry without rashes, lesions, ecchymosis. Neuro: Cranial nerves intact, reflexes equal bilaterally. Normal muscle tone, no cerebellar symptoms. Sensation intact.  Psych: Awake and oriented X 3, normal affect, Insight and Judgment appropriate.   EKG: WNL no ST changes. AORTA SCAN: defer  Scott Sutton 1:44 PM [facility]

## 2019-06-23 ENCOUNTER — Encounter: Payer: Self-pay | Admitting: Physician Assistant

## 2019-07-21 NOTE — Progress Notes (Signed)
CPE  Assessment and Plan:  Encounter for general adult medical examination with abnormal findings 1 year  Essential hypertension - continue medications, DASH diet, exercise and monitor at home. Call if greater than 130/80.  -     CBC with Differential/Platelet -     COMPLETE METABOLIC PANEL WITH GFR -     Urinalysis, Routine w reflex microscopic -     Microalbumin / creatinine urine ratio  Hypercholesteremia check lipids- has had very high trig elevations, may want to add on vasepa versus statin decrease fatty foods increase activity.  -     Lipid panel  Hypothyroidism due to Hashimoto's thyroiditis -     TSH Hypothyroidism-check TSH level, continue medications the same, reminded to take on an empty stomach 30-99mins before food.   Class 2 severe obesity due to excess calories with serious comorbidity and body mass index (BMI) of 35.0 to 35.9 in adult [facility]) -     Testosterone - follow up 3 months for progress monitoring - increase veggies, decrease carbs- patient states he eats very well - long discussion about weight loss, diet, and exercise  B12 deficiency -     Vitamin B12  Medication management -     Magnesium  Vitamin D deficiency -     VITAMIN D 25 Hydroxy (Vit-D Deficiency, Fractures)  Smoker Smoking cessation-  instruction/counseling given, counseled patient on the dangers of tobacco use, advised patient to stop smoking, and reviewed strategies to maximize success  History of migraine Monitor  Eczema, unspecified type .montior  Iron deficiency -     Iron,Total/Total Iron Binding Cap  Fatigue, unspecified type ? Need sleep study- will review if that is the case- given information Diet and sleep hygiene discussed Add in walking/movement 3 x a week for 10 mins Check labs ? Component of depression/anxiety, however will rule out other causes and do lifestyle modification first- patient may benefit from psych referral.  -     Iron,Total/Total Iron Binding  Cap -     Vitamin B12 -     Celiac Disease Comprehensive Panel with Reflexes  Screening PSA (prostate specific antigen) -     PSA   Discussed med's effects and SE's. Screening labs and tests as requested with regular follow-up as recommended. Over 40 minutes of exam, counseling, chart review and critical decision making was performed  HPI Patient presents for a complete physical as an overdue follow up.   He complains that he is always tired, feels exhausted when he wakes up to when he goes to bed. He constantly feels hungry and has heat intolerance. He states he has a poor memory. He has had diarrhea occ, worse last week, no blood in it. He gets very tired after eating.  Negative RPR 2016, denies tick exposure.   He has history of depression/anxiety- has been on several agents in the past like prozac, depakote, wellbutrin, xanax, zoloft.States he had been on 40 mg valium in a day. At one point he was on 5 meds for depression/anxiety at one time.  The SSRI's he was having mood swings.   His blood pressure has been controlled at home, started on lisinopril 40 mg last visit, has had BP x teens, today their BP is BP: 132/82.  He works 1st shift. He does not snore, does not stop breathing per partner.   BMI is Body mass index is 36.83 kg/m., he is working on diet and exercise. Wt Readings from Last 3 Encounters:  07/22/19 253 lb (  114.8 kg)  08/17/18 250 lb (113.4 kg)  05/15/18 242 lb 9.6 oz (110 kg)   B12 was low last visit, added supplement Lab Results  Component Value Date   VITAMINB12 322 04/13/2018   He was started on thyroid medication last visit. He has history of low thyroid with  Lab Results  Component Value Date   TSH 2.02 08/17/2018  .  He has history of skin issues, told it is eczema, has improved.   He does not workout. He denies chest pain, shortness of breath, dizziness.   He was started on zinc last visit, states he has decreased sex drive.  Lab Results   Component Value Date   TESTOSTERONE 256 04/13/2018   The cholesterol last visit was VERY elevated putting him on risk for pancreatitis, need to recheck with thyroid correction, if not better will add on vasepa:   Lab Results  Component Value Date   CHOL 185 08/17/2018   HDL 30 (L) 08/17/2018   LDLCALC 114 (H) 08/17/2018   TRIG 287 (H) 08/17/2018   CHOLHDL 6.2 (H) 08/17/2018    Last A1C in the office was:  Lab Results  Component Value Date   HGBA1C 5.4 04/13/2018   He is on 1 pill a week of vitamin D, will recheck today as well.  Vit D, 25-Hydroxy  Date Value Ref Range Status  05/15/2018 28 (L) 30 - 100 ng/mL Final    Comment:    Vitamin D Status         25-OH Vitamin D: . Deficiency:                    <20 ng/mL Insufficiency:             20 - 29 ng/mL Optimal:                 > or = 30 ng/mL . For 25-OH Vitamin D testing on patients on  D2-supplementation and patients for whom quantitation  of D2 and D3 fractions is required, the QuestAssureD(TM) 25-OH VIT D, (D2,D3), LC/MS/MS is recommended: order  code 03212 (patients >65yrs). . For more information on this test, go to: http://education.questdiagnostics.com/faq/FAQ163 (This link is being provided for  informational/educational purposes only.)     Current Medications:  Current Outpatient Medications on File Prior to Visit  Medication Sig Dispense Refill  . Cyanocobalamin (B-12) 500 MCG SUBL One daily 30 tablet 3  . fenofibrate (TRICOR) 145 MG tablet TAKE ONE TABLET BY MOUTH ONE TIME DAILY  for triglycerides 90 tablet 0  . levothyroxine (SYNTHROID) 75 MCG tablet Take 1 tablet daily on an empty stomach with only water for 30 minutes & no Antacid meds, Calcium or Magnesium for 4 hours & avoid Biotin 90 tablet 0  . lisinopril (ZESTRIL) 40 MG tablet Take 1 tablet Dauily for BP & Diabetic Kidney Protection 90 tablet 0  . metFORMIN (GLUCOPHAGE XR) 750 MG 24 hr tablet Take 1 tablet (750 mg total) by mouth 2 (two) times  daily with a meal. 60 tablet 3  . phentermine (ADIPEX-P) 37.5 MG tablet Take 1 tablet (37.5 mg total) by mouth daily before breakfast. 30 tablet 2  . Vitamin D, Ergocalciferol, (DRISDOL) 1.25 MG (50000 UT) CAPS capsule TAKE 1 CAPSULE BY MOUTH EVERY WEEK 8 capsule 3  . zinc gluconate 50 MG tablet Take 1 tablet (50 mg total) by mouth daily.     No current facility-administered medications on file prior to visit.  Allergies:  No Known Allergies   Health Maintenance:  There is no immunization history for the selected administration types on file for this patient.  Tetanus: DUE but declines Pneumovax: Prevnar 13: Flu vaccine: DECLINES Zostavax:  DEXA: N/A Colonoscopy: due age 46 EGD:  Patient Care Team: Lucky Cowboy, MD as PCP - General (Internal Medicine)  Medical History:  has Hypertension; Hypothyroidism; Smoker; Class 2 severe obesity due to excess calories with serious comorbidity and body mass index (BMI) of 35.0 to 35.9 in adult [facility]); Vitamin D deficiency; Eczema; History of migraine; Hypercholesteremia; B12 deficiency; and Medication management on their problem list.   Surgical History:  He  has no past surgical history on file. Family History:  His family history includes AAA (abdominal aortic aneurysm) in his maternal grandmother; Aneurysm in his father; Cancer in his maternal grandfather and mother; Crohn's disease in his sister; Diabetes in his paternal aunt and paternal uncle; Hypertension in his father and sister; Stroke in his father, maternal grandmother, and paternal grandfather. Social History:   reports that he quit smoking about 6 years ago. His smoking use included cigarettes. He has a 20.00 pack-year smoking history. He has never used smokeless tobacco. He reports previous alcohol use. He reports that he does not use drugs.   Review of Systems:  Review of Systems  Constitutional: Positive for malaise/fatigue. Negative for chills, diaphoresis, fever and  weight loss.  HENT: Negative.   Eyes: Negative.   Respiratory: Negative.   Cardiovascular: Negative.   Gastrointestinal: Negative.   Genitourinary: Negative.   Musculoskeletal: Negative.   Skin: Negative.     Physical Exam: Estimated body mass index is 36.83 kg/m as calculated from the following:   Height as of this encounter: 5' 9.5" (1.765 m).   Weight as of this encounter: 253 lb (114.8 kg). BP 132/82   Pulse 73   Temp 98.5 F (36.9 C)   Ht 5' 9.5" (1.765 m)   Wt 253 lb (114.8 kg)   SpO2 98%   BMI 36.83 kg/m  General Appearance: Well nourished, in no apparent distress.  Eyes: PERRLA, EOMs, conjunctiva no swelling or erythema, normal fundi and vessels.  Sinuses: No Frontal/maxillary tenderness  ENT/Mouth: Ext aud canals clear, normal light reflex with TMs without erythema, bulging. Good dentition. No erythema, swelling, or exudate on post pharynx. Tonsils not swollen or erythematous. Hearing normal.  Neck: Supple, thyroid normal. No bruits  Respiratory: Respiratory effort normal, BS equal bilaterally without rales, rhonchi, wheezing or stridor.  Cardio: RRR without murmurs, rubs or gallops. Brisk peripheral pulses without edema.  Chest: symmetric, with normal excursions and percussion.  Abdomen: Soft, nontender, no guarding, rebound, hernias, masses, or organomegaly.  Lymphatics: Non tender without lymphadenopathy.  Musculoskeletal: Full ROM all peripheral extremities,5/5 strength, and normal gait.  Skin: Warm, dry without rashes, lesions, ecchymosis. Neuro: Cranial nerves intact, reflexes equal bilaterally. Normal muscle tone, no cerebellar symptoms. Sensation intact.  Psych: Awake and oriented X 3, normal affect, Insight and Judgment appropriate.   EKG: WNL no ST changes. AORTA SCAN: defer  Quentin Mulling 2:05 PM [facility] Adolescent Internal Medicine

## 2019-07-22 ENCOUNTER — Ambulatory Visit: Payer: PRIVATE HEALTH INSURANCE | Admitting: Physician Assistant

## 2019-07-22 ENCOUNTER — Encounter: Payer: Self-pay | Admitting: Physician Assistant

## 2019-07-22 ENCOUNTER — Other Ambulatory Visit: Payer: Self-pay

## 2019-07-22 VITALS — BP 132/82 | HR 73 | Temp 98.5°F | Ht 69.5 in | Wt 253.0 lb

## 2019-07-22 DIAGNOSIS — E038 Other specified hypothyroidism: Secondary | ICD-10-CM

## 2019-07-22 DIAGNOSIS — Z1389 Encounter for screening for other disorder: Secondary | ICD-10-CM

## 2019-07-22 DIAGNOSIS — Z125 Encounter for screening for malignant neoplasm of prostate: Secondary | ICD-10-CM | POA: Diagnosis not present

## 2019-07-22 DIAGNOSIS — Z1329 Encounter for screening for other suspected endocrine disorder: Secondary | ICD-10-CM

## 2019-07-22 DIAGNOSIS — E559 Vitamin D deficiency, unspecified: Secondary | ICD-10-CM | POA: Diagnosis not present

## 2019-07-22 DIAGNOSIS — R350 Frequency of micturition: Secondary | ICD-10-CM | POA: Diagnosis not present

## 2019-07-22 DIAGNOSIS — Z79899 Other long term (current) drug therapy: Secondary | ICD-10-CM

## 2019-07-22 DIAGNOSIS — Z8669 Personal history of other diseases of the nervous system and sense organs: Secondary | ICD-10-CM

## 2019-07-22 DIAGNOSIS — E66812 Obesity, class 2: Secondary | ICD-10-CM

## 2019-07-22 DIAGNOSIS — Z13220 Encounter for screening for lipoid disorders: Secondary | ICD-10-CM

## 2019-07-22 DIAGNOSIS — L309 Dermatitis, unspecified: Secondary | ICD-10-CM

## 2019-07-22 DIAGNOSIS — Z0001 Encounter for general adult medical examination with abnormal findings: Secondary | ICD-10-CM

## 2019-07-22 DIAGNOSIS — E538 Deficiency of other specified B group vitamins: Secondary | ICD-10-CM

## 2019-07-22 DIAGNOSIS — E7800 Pure hypercholesterolemia, unspecified: Secondary | ICD-10-CM

## 2019-07-22 DIAGNOSIS — Z131 Encounter for screening for diabetes mellitus: Secondary | ICD-10-CM

## 2019-07-22 DIAGNOSIS — Z130 Encounter for screening for diseases of the blood and blood-forming organs and certain disorders involving the immune mechanism: Secondary | ICD-10-CM | POA: Diagnosis not present

## 2019-07-22 DIAGNOSIS — E063 Autoimmune thyroiditis: Secondary | ICD-10-CM

## 2019-07-22 DIAGNOSIS — F17200 Nicotine dependence, unspecified, uncomplicated: Secondary | ICD-10-CM

## 2019-07-22 DIAGNOSIS — N401 Enlarged prostate with lower urinary tract symptoms: Secondary | ICD-10-CM

## 2019-07-22 DIAGNOSIS — Z0000 Encounter for general adult medical examination without abnormal findings: Secondary | ICD-10-CM

## 2019-07-22 DIAGNOSIS — R5383 Other fatigue: Secondary | ICD-10-CM

## 2019-07-22 DIAGNOSIS — E611 Iron deficiency: Secondary | ICD-10-CM

## 2019-07-22 DIAGNOSIS — I10 Essential (primary) hypertension: Secondary | ICD-10-CM

## 2019-07-22 DIAGNOSIS — Z6835 Body mass index (BMI) 35.0-35.9, adult: Secondary | ICD-10-CM

## 2019-07-22 MED ORDER — FENOFIBRATE 145 MG PO TABS: ORAL_TABLET | ORAL | 0 refills | Status: DC

## 2019-07-22 NOTE — Patient Instructions (Addendum)
Get back on sublingual B12 Get back on vitamin D  Check out the book Fiber fuel  I think it is possible that you have sleep apnea. It can cause interrupted sleep, headaches, frequent awakenings, fatigue, dry mouth, fast/slow heart beats, memory issues, anxiety/depression, swelling, numbness tingling hands/feet, weight gain, shortness of breath, and the list goes on. Sleep apnea needs to be ruled out because if it is left untreated it does eventually lead to abnormal heart beats, lung failure or heart failure as well as increasing the risk of heart attack and stroke. There are masks you can wear OR a mouth piece that I can give you information about. Often times though people feel MUCH better after getting treatment.   Sleep Apnea  Sleep apnea is a sleep disorder characterized by abnormal pauses in breathing while you sleep. When your breathing pauses, the level of oxygen in your blood decreases. This causes you to move out of deep sleep and into light sleep. As a result, your quality of sleep is poor, and the system that carries your blood throughout your body (cardiovascular system) experiences stress. If sleep apnea remains untreated, the following conditions can develop:  High blood pressure (hypertension).  Coronary artery disease.  Inability to achieve or maintain an erection (impotence).  Impairment of your thought process (cognitive dysfunction). There are three types of sleep apnea: 1. Obstructive sleep apnea--Pauses in breathing during sleep because of a blocked airway. 2. Central sleep apnea--Pauses in breathing during sleep because the area of the brain that controls your breathing does not send the correct signals to the muscles that control breathing. 3. Mixed sleep apnea--A combination of both obstructive and central sleep apnea.  RISK FACTORS The following risk factors can increase your risk of developing sleep apnea:  Being overweight.  Smoking.  Having narrow passages in  your nose and throat.  Being of older age.  Being male.  Alcohol use.  Sedative and tranquilizer use.  Ethnicity. Among individuals younger than 35 years, African Americans are at increased risk of sleep apnea. SYMPTOMS   Difficulty staying asleep.  Daytime sleepiness and fatigue.  Loss of energy.  Irritability.  Loud, heavy snoring.  Morning headaches.  Trouble concentrating.  Forgetfulness.  Decreased interest in sex. DIAGNOSIS  In order to diagnose sleep apnea, your caregiver will perform a physical examination. Your caregiver may suggest that you take a home sleep test. Your caregiver may also recommend that you spend the night in a sleep lab. In the sleep lab, several monitors record information about your heart, lungs, and brain while you sleep. Your leg and arm movements and blood oxygen level are also recorded. TREATMENT The following actions may help to resolve mild sleep apnea:  Sleeping on your side.   Using a decongestant if you have nasal congestion.   Avoiding the use of depressants, including alcohol, sedatives, and narcotics.   Losing weight and modifying your diet if you are overweight. There also are devices and treatments to help open your airway:  Oral appliances. These are custom-made mouthpieces that shift your lower jaw forward and slightly open your bite. This opens your airway.  Devices that create positive airway pressure. This positive pressure "splints" your airway open to help you breathe better during sleep. The following devices create positive airway pressure:  Continuous positive airway pressure (CPAP) device. The CPAP device creates a continuous level of air pressure with an air pump. The air is delivered to your airway through a mask while you sleep.  This continuous pressure keeps your airway open.  Nasal expiratory positive airway pressure (EPAP) device. The EPAP device creates positive air pressure as you exhale. The device  consists of single-use valves, which are inserted into each nostril and held in place by adhesive. The valves create very little resistance when you inhale but create much more resistance when you exhale. That increased resistance creates the positive airway pressure. This positive pressure while you exhale keeps your airway open, making it easier to breath when you inhale again.  Bilevel positive airway pressure (BPAP) device. The BPAP device is used mainly in patients with central sleep apnea. This device is similar to the CPAP device because it also uses an air pump to deliver continuous air pressure through a mask. However, with the BPAP machine, the pressure is set at two different levels. The pressure when you exhale is lower than the pressure when you inhale.  Surgery. Typically, surgery is only done if you cannot comply with less invasive treatments or if the less invasive treatments do not improve your condition. Surgery involves removing excess tissue in your airway to create a wider passage way. Document Released: 02/15/2002 Document Revised: 06/22/2012 Document Reviewed: 07/04/2011 [facility] Patient Information 2015 [place], [place]. This information is not intended to replace advice given to you by your health care provider. Make sure you discuss any questions you have with your health care provider.

## 2019-07-23 DIAGNOSIS — Z6835 Body mass index (BMI) 35.0-35.9, adult: Secondary | ICD-10-CM

## 2019-07-23 LAB — URINALYSIS, ROUTINE W REFLEX MICROSCOPIC
Bacteria, UA: NONE SEEN /HPF
Bilirubin Urine: NEGATIVE
Glucose, UA: NEGATIVE
Hgb urine dipstick: NEGATIVE
Ketones, ur: NEGATIVE
Nitrite: NEGATIVE
Protein, ur: NEGATIVE
RBC / HPF: NONE SEEN /HPF (ref 0–2)
Specific Gravity, Urine: 1.019 (ref 1.001–1.03)
pH: 7 (ref 5.0–8.0)

## 2019-07-23 LAB — LIPID PANEL
Cholesterol: 192 mg/dL (ref <200)
HDL: 30 mg/dL — ABNORMAL LOW (ref >=40)
Non-HDL Cholesterol (Calc): 162 mg/dL (calc) — ABNORMAL HIGH (ref <130)
Total CHOL/HDL Ratio: 6.4 (calc) — ABNORMAL HIGH (ref <5.0)
Triglycerides: 716 mg/dL — ABNORMAL HIGH (ref <150)

## 2019-07-23 LAB — CBC WITH DIFFERENTIAL/PLATELET
Absolute Monocytes: 648 cells/uL (ref 200–950)
Basophils Absolute: 63 cells/uL (ref 0–200)
Basophils Relative: 0.8 %
Eosinophils Absolute: 87 cells/uL (ref 15–500)
Eosinophils Relative: 1.1 %
HCT: 48.6 % (ref 38.5–50.0)
Hemoglobin: 16.3 g/dL (ref 13.2–17.1)
Lymphs Abs: 2015 cells/uL (ref 850–3900)
MCH: 30.4 pg (ref 27.0–33.0)
MCHC: 33.5 g/dL (ref 32.0–36.0)
MCV: 90.5 fL (ref 80.0–100.0)
MPV: 10.1 fL (ref 7.5–12.5)
Monocytes Relative: 8.2 %
Neutro Abs: 5088 cells/uL (ref 1500–7800)
Neutrophils Relative %: 64.4 %
Platelets: 286 10*3/uL (ref 140–400)
RBC: 5.37 10*6/uL (ref 4.20–5.80)
RDW: 12.7 % (ref 11.0–15.0)
Total Lymphocyte: 25.5 %
WBC: 7.9 10*3/uL (ref 3.8–10.8)

## 2019-07-23 LAB — COMPLETE METABOLIC PANEL WITH GFR
AG Ratio: 1.8 (calc) (ref 1.0–2.5)
ALT: 41 U/L (ref 9–46)
AST: 24 U/L (ref 10–40)
Albumin: 4.9 g/dL (ref 3.6–5.1)
Alkaline phosphatase (APISO): 59 U/L (ref 36–130)
BUN: 18 mg/dL (ref 7–25)
CO2: 29 mmol/L (ref 20–32)
Calcium: 10.2 mg/dL (ref 8.6–10.3)
Chloride: 100 mmol/L (ref 98–110)
Creat: 0.98 mg/dL (ref 0.60–1.35)
GFR, Est African American: 111 mL/min/{1.73_m2} (ref >=60)
GFR, Est Non African American: 96 mL/min/{1.73_m2} (ref >=60)
Globulin: 2.8 g/dL (calc) (ref 1.9–3.7)
Glucose, Bld: 89 mg/dL (ref 65–99)
Potassium: 4.1 mmol/L (ref 3.5–5.3)
Sodium: 140 mmol/L (ref 135–146)
Total Bilirubin: 0.3 mg/dL (ref 0.2–1.2)
Total Protein: 7.7 g/dL (ref 6.1–8.1)

## 2019-07-23 LAB — MAGNESIUM: Magnesium: 2.2 mg/dL (ref 1.5–2.5)

## 2019-07-23 LAB — IRON, TOTAL/TOTAL IRON BINDING CAP
%SAT: 33 % (calc) (ref 20–48)
Iron: 99 ug/dL (ref 50–180)
TIBC: 299 mcg/dL (calc) (ref 250–425)

## 2019-07-23 LAB — MICROALBUMIN / CREATININE URINE RATIO
Creatinine, Urine: 114 mg/dL (ref 20–320)
Microalb Creat Ratio: 39 mcg/mg creat — ABNORMAL HIGH (ref <30)
Microalb, Ur: 4.5 mg/dL

## 2019-07-23 LAB — CELIAC DISEASE COMPREHENSIVE PANEL WITH REFLEXES
(tTG) Ab, IgA: 1 U/mL
Immunoglobulin A: 756 mg/dL — ABNORMAL HIGH (ref 47–310)

## 2019-07-23 LAB — VITAMIN D 25 HYDROXY (VIT D DEFICIENCY, FRACTURES): Vit D, 25-Hydroxy: 17 ng/mL — ABNORMAL LOW (ref 30–100)

## 2019-07-23 LAB — PSA: PSA: 2 ng/mL (ref <=4.0)

## 2019-07-23 LAB — TESTOSTERONE: Testosterone: 192 ng/dL — ABNORMAL LOW (ref 250–827)

## 2019-07-23 LAB — INSULIN, RANDOM: Insulin: 40.1 u[IU]/mL — ABNORMAL HIGH

## 2019-07-23 LAB — TSH: TSH: 2.12 mIU/L (ref 0.40–4.50)

## 2019-07-23 LAB — VITAMIN B12: Vitamin B-12: 365 pg/mL (ref 200–1100)

## 2019-07-26 MED ORDER — "NEEDLE (DISP) 21G X 1"" MISC": 0 refills | Status: AC

## 2019-07-26 MED ORDER — DIAZEPAM 2 MG PO TABS
2.0000 mg | ORAL_TABLET | Freq: Every day | ORAL | 0 refills | Status: DC | PRN
Start: 2019-07-26 — End: 2019-12-28

## 2019-07-26 MED ORDER — TESTOSTERONE CYPIONATE 200 MG/ML IM SOLN: INTRAMUSCULAR | 2 refills | Status: DC

## 2019-07-26 MED ORDER — PHENTERMINE HCL 37.5 MG PO TABS: 37.5000 mg | ORAL_TABLET | Freq: Every day | ORAL | 2 refills | Status: DC

## 2019-07-26 NOTE — Addendum Note (Signed)
Addended by: Quentin Mulling R on: 07/26/2019 01:40 PM   Modules accepted: Orders

## 2019-08-25 ENCOUNTER — Other Ambulatory Visit: Payer: Self-pay

## 2019-08-25 DIAGNOSIS — E7800 Pure hypercholesterolemia, unspecified: Secondary | ICD-10-CM

## 2019-08-25 MED ORDER — FENOFIBRATE 145 MG PO TABS: ORAL_TABLET | ORAL | 1 refills | Status: DC

## 2019-09-01 ENCOUNTER — Encounter: Payer: Self-pay | Admitting: Physician Assistant

## 2019-09-01 ENCOUNTER — Ambulatory Visit (INDEPENDENT_AMBULATORY_CARE_PROVIDER_SITE_OTHER): Payer: PRIVATE HEALTH INSURANCE | Admitting: Physician Assistant

## 2019-09-01 ENCOUNTER — Other Ambulatory Visit: Payer: Self-pay

## 2019-09-01 VITALS — BP 122/80 | HR 63 | Temp 97.5°F | Wt 250.0 lb

## 2019-09-01 DIAGNOSIS — E291 Testicular hypofunction: Secondary | ICD-10-CM

## 2019-09-01 DIAGNOSIS — E7800 Pure hypercholesterolemia, unspecified: Secondary | ICD-10-CM

## 2019-09-01 DIAGNOSIS — Z6835 Body mass index (BMI) 35.0-35.9, adult: Secondary | ICD-10-CM

## 2019-09-01 DIAGNOSIS — E538 Deficiency of other specified B group vitamins: Secondary | ICD-10-CM

## 2019-09-01 DIAGNOSIS — E038 Other specified hypothyroidism: Secondary | ICD-10-CM | POA: Diagnosis not present

## 2019-09-01 DIAGNOSIS — Z79899 Other long term (current) drug therapy: Secondary | ICD-10-CM

## 2019-09-01 DIAGNOSIS — E559 Vitamin D deficiency, unspecified: Secondary | ICD-10-CM

## 2019-09-01 DIAGNOSIS — E063 Autoimmune thyroiditis: Secondary | ICD-10-CM

## 2019-09-01 MED ORDER — VITAMIN D (ERGOCALCIFEROL) 1.25 MG (50000 UNIT) PO CAPS: ORAL_CAPSULE | ORAL | 3 refills | Status: DC

## 2019-09-01 MED ORDER — "NEEDLE (DISP) 25G X 1"" MISC": 0 refills | Status: DC

## 2019-09-01 NOTE — Progress Notes (Signed)
Follow up  Hypogonadism in male -     Testosterone - sent in smaller needles  Class 2 severe obesity due to excess calories with serious comorbidity and body mass index (BMI) of 35.0 to 35.9 in adult [facility]) - follow up 3 months for progress monitoring - increase veggies, decrease carbs - long discussion about weight loss, diet, and exercise Suggest sleep study- declines  Hypothyroidism due to Hashimoto's thyroiditis Continue meds  B12 deficiency Continue meds  Vitamin D deficiency -     Vitamin D, Ergocalciferol, (DRISDOL) 1.25 MG (50000 UNIT) CAPS capsule; TAKE 1 CAPSULE BY MOUTH EVERY WEEK  Hypercholesteremia -     Lipid panel  Medication management -     CBC with Differential/Platelet -     COMPLETE METABOLIC PANEL WITH GFR -     CBC with Differential/Platelet -     COMPLETE METABOLIC PANEL WITH GFR     39 y.o. 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   He complained that he is always felt tired.  He is eating better, and on his medications and states he is feeling better.  He does state he has good days and bad days, he is tearful and states he sometimes does not want to get out of bed but uses CBT methods to help him try to see the positive side. He denies SI/HI. Declines referral or medications, has been on several bipolar meds in the past and states that he does not like how they make him feel.  He declines a sleep study.  Negative celiac panel  Negative RPR 2016, denies tick exposure.   He has a history of testosterone deficiency and is on testosterone replacement x 2 shots, last shot was 06/10 or 12th. He states that the testosterone helps with his energy, libido, muscle mass and he has changed his diet helps that help.   Lab Results  Component Value Date   TESTOSTERONE 192 (L) 07/22/2019   He is on B12 now.  Lab Results  Component Value Date   OVZCHYIF02  774 07/22/2019   Lab Results  Component Value Date   IRON 99 07/22/2019   TIBC 299 07/22/2019   Lab Results  Component Value Date   CREATININE 0.98 07/22/2019   BUN 18 07/22/2019   NA 140 07/22/2019   K 4.1 07/22/2019   CL 100 07/22/2019   CO2 29 07/22/2019   Lab Results  Component Value Date   TSH 2.12 07/22/2019   BMI is Body mass index is 36.39 kg/m., he is working on diet and exercise. Wt Readings from Last 3 Encounters:  09/01/19 250 lb (113.4 kg)  07/22/19 253 lb (114.8 kg)  08/17/18 250 lb (113.4 kg)   He ate around 1 pm, wraps on wheat flat bread and zucchini boats with cheese, will recheck cholesterol.   Blood pressure 122/80, pulse 63, temperature (!) 97.5 F (36.4 C), weight 250 lb (113.4 kg), SpO2 97 %.  Medications  Current Outpatient Medications (Endocrine & Metabolic):  .  levothyroxine (SYNTHROID) 75 MCG tablet, Take 1 tablet daily on an empty stomach with only water for 30 minutes & no Antacid meds, Calcium or Magnesium for 4 hours & avoid Biotin .  metFORMIN (GLUCOPHAGE XR) 750 MG 24 hr tablet, Take 1 tablet (750 mg total) by mouth 2 (two) times daily with a meal. .  testosterone cypionate (DEPO-TESTOSTERONE) 200 MG/ML injection, 2 cc intramuscularly every  2 weeks OR as directed by your doctor  Current Outpatient Medications (Cardiovascular):  .  fenofibrate (TRICOR) 145 MG tablet, TAKE ONE TABLET BY MOUTH ONE TIME DAILY  for triglycerides .  lisinopril (ZESTRIL) 40 MG tablet, Take 1 tablet Dauily for BP & Diabetic Kidney Protection    Current Outpatient Medications (Hematological):  Marland Kitchen  Cyanocobalamin (B-12) 500 MCG SUBL, One daily  Current Outpatient Medications (Other):  .  diazepam (VALIUM) 2 MG tablet, Take 1 tablet (2 mg total) by mouth daily as needed for anxiety (do not take daily). Marland Kitchen  NEEDLE, DISP, 21 G 21G X 1" MISC, Use for testosterone injections, 3 ml .  phentermine (ADIPEX-P) 37.5 MG tablet, Take 1 tablet (37.5 mg total) by mouth daily  before breakfast. .  Vitamin D, Ergocalciferol, (DRISDOL) 1.25 MG (50000 UNIT) CAPS capsule, TAKE 1 CAPSULE BY MOUTH EVERY WEEK .  zinc gluconate 50 MG tablet, Take 1 tablet (50 mg total) by mouth daily. Marland Kitchen  NEEDLE, DISP, 25 G 25G X 1" MISC, Use IM every 1-2 weeks for testosterone injection  Problem list He has Hypertension; Hypothyroidism; Smoker; Class 2 severe obesity due to excess calories with serious comorbidity and body mass index (BMI) of 35.0 to 35.9 in adult [facility]); Vitamin D deficiency; Eczema; History of migraine; Hypercholesteremia; B12 deficiency; Medication management; and Hypogonadism in male on their problem list.   Review of Systems  Constitutional: Positive for malaise/fatigue. Negative for chills, diaphoresis, fever and weight loss.  HENT: Negative.   Eyes: Negative.   Respiratory: Negative.   Cardiovascular: Negative.   Gastrointestinal: Negative.   Genitourinary: Negative.   Musculoskeletal: Negative.   Skin: Negative.     Physical Exam General Appearance: Well nourished, in no apparent distress.  Eyes: PERRLA, EOMs, conjunctiva no swelling or erythema, normal fundi and vessels.  Sinuses: No Frontal/maxillary tenderness  ENT/Mouth: Ext aud canals clear, normal light reflex with TMs without erythema, bulging. Good dentition. No erythema, swelling, or exudate on post pharynx. Tonsils not swollen or erythematous. Hearing normal.  Neck: Supple, thyroid normal. No bruits  Respiratory: Respiratory effort normal, BS equal bilaterally without rales, rhonchi, wheezing or stridor.  Cardio: RRR without murmurs, rubs or gallops. Brisk peripheral pulses without edema.  Chest: symmetric, with normal excursions and percussion.  Abdomen: Soft, nontender, no guarding, rebound, hernias, masses, or organomegaly.  Lymphatics: Non tender without lymphadenopathy.  Musculoskeletal: Full ROM all peripheral extremities,5/5 strength, and normal gait.  Skin: Warm, dry without rashes,  lesions, ecchymosis. Neuro: Cranial nerves intact, reflexes equal bilaterally. Normal muscle tone, no cerebellar symptoms. Sensation intact.  Psych: Awake and oriented X 3, normal affect, Insight and Judgment appropriate.     Future Appointments  Date Time Provider Department Center  11/01/2019  3:30 PM Quentin Mulling, PA-C [facility] None  08/02/2020  3:00 PM Quentin Mulling, PA-C [facility] None

## 2019-09-01 NOTE — Patient Instructions (Addendum)
    Please check out Vrylar for bipolar depression  General eating tips  What to Avoid . Avoid added sugars o Often added sugar can be found in processed foods such as many condiments, dry cereals, cakes, cookies, chips, crisps, crackers, candies, sweetened drinks, etc.  o Read labels and AVOID/DECREASE use of foods with the following in their ingredient list: Sugar, fructose, high fructose corn syrup, sucrose, glucose, maltose, dextrose, molasses, cane sugar, [place] sugar, any type of syrup, agave nectar, etc.   . Avoid snacking in between meals- drink water or if you feel you need a snack, pick a high water content snack such as cucumbers, watermelon, or any veggie.  Marland Kitchen Avoid foods made with flour o If you are going to eat food made with flour, choose those made with whole-grains; and, minimize your consumption as much as is tolerable . Avoid processed foods o These foods are generally stocked in the middle of the grocery store.  o Focus on shopping on the perimeter of the grocery.  What to Include . Vegetables o GREEN LEAFY VEGETABLES: Kale, spinach, mustard greens, collard greens, cabbage, broccoli, etc. o OTHER: Asparagus, cauliflower, eggplant, carrots, peas, Brussel sprouts, tomatoes, bell peppers, zucchini, beets, cucumbers, etc. . Grains, seeds, and legumes o Beans: kidney beans, black eyed peas, garbanzo beans, black beans, pinto beans, etc. o Whole, unrefined grains: Patient rice, barley, bulgur, oatmeal, etc. . Healthy fats  o Avoid highly processed fats such as vegetable oil o Examples of healthy fats: avocado, olives, virgin olive oil, dark chocolate (?72% Cocoa), nuts (peanuts, almonds, walnuts, cashews, pecans, etc.) o Please still do small amount of these healthy fats, they are dense in calories.  . Low - Moderate Intake of Animal Sources of Protein o Meat sources: chicken, [place], salmon, tuna. Limit to 4 ounces of meat at one time or the size of your palm. o Consider  limiting dairy sources, but when choosing dairy focus on: PLAIN [place] yogurt, cottage cheese, high-protein milk . Fruit o Choose berries

## 2019-09-02 ENCOUNTER — Ambulatory Visit: Payer: PRIVATE HEALTH INSURANCE | Admitting: Physician Assistant

## 2019-09-02 LAB — CBC WITH DIFFERENTIAL/PLATELET
Absolute Monocytes: 705 cells/uL (ref 200–950)
Basophils Absolute: 57 cells/uL (ref 0–200)
Basophils Relative: 0.7 %
Eosinophils Absolute: 89 cells/uL (ref 15–500)
Eosinophils Relative: 1.1 %
HCT: 47.7 % (ref 38.5–50.0)
Hemoglobin: 16.1 g/dL (ref 13.2–17.1)
Lymphs Abs: 1993 cells/uL (ref 850–3900)
MCH: 30.4 pg (ref 27.0–33.0)
MCHC: 33.8 g/dL (ref 32.0–36.0)
MCV: 90.2 fL (ref 80.0–100.0)
MPV: 10.1 fL (ref 7.5–12.5)
Monocytes Relative: 8.7 %
Neutro Abs: 5257 cells/uL (ref 1500–7800)
Neutrophils Relative %: 64.9 %
Platelets: 296 10*3/uL (ref 140–400)
RBC: 5.29 10*6/uL (ref 4.20–5.80)
RDW: 13 % (ref 11.0–15.0)
Total Lymphocyte: 24.6 %
WBC: 8.1 10*3/uL (ref 3.8–10.8)

## 2019-09-02 LAB — COMPLETE METABOLIC PANEL WITH GFR
AG Ratio: 1.9 (calc) (ref 1.0–2.5)
ALT: 36 U/L (ref 9–46)
AST: 26 U/L (ref 10–40)
Albumin: 5.1 g/dL (ref 3.6–5.1)
Alkaline phosphatase (APISO): 46 U/L (ref 36–130)
BUN: 21 mg/dL (ref 7–25)
CO2: 28 mmol/L (ref 20–32)
Calcium: 11 mg/dL — ABNORMAL HIGH (ref 8.6–10.3)
Chloride: 101 mmol/L (ref 98–110)
Creat: 1.15 mg/dL (ref 0.60–1.35)
GFR, Est African American: 92 mL/min/{1.73_m2} (ref >=60)
GFR, Est Non African American: 79 mL/min/{1.73_m2} (ref >=60)
Globulin: 2.7 g/dL (calc) (ref 1.9–3.7)
Glucose, Bld: 85 mg/dL (ref 65–99)
Potassium: 4.3 mmol/L (ref 3.5–5.3)
Sodium: 142 mmol/L (ref 135–146)
Total Bilirubin: 0.4 mg/dL (ref 0.2–1.2)
Total Protein: 7.8 g/dL (ref 6.1–8.1)

## 2019-09-02 LAB — LIPID PANEL
Cholesterol: 173 mg/dL (ref <200)
HDL: 29 mg/dL — ABNORMAL LOW (ref >=40)
LDL Cholesterol (Calc): 104 mg/dL (calc) — ABNORMAL HIGH
Non-HDL Cholesterol (Calc): 144 mg/dL (calc) — ABNORMAL HIGH (ref <130)
Total CHOL/HDL Ratio: 6 (calc) — ABNORMAL HIGH (ref <5.0)
Triglycerides: 299 mg/dL — ABNORMAL HIGH (ref <150)

## 2019-09-02 LAB — TESTOSTERONE: Testosterone: 556 ng/dL (ref 250–827)

## 2019-09-02 MED ORDER — VITAMIN D (ERGOCALCIFEROL) 1.25 MG (50000 UNIT) PO CAPS: ORAL_CAPSULE | ORAL | 3 refills | Status: DC

## 2019-09-02 NOTE — Addendum Note (Signed)
Addended by: Quentin Mulling R on: 09/02/2019 01:55 PM   Modules accepted: Orders

## 2019-09-20 ENCOUNTER — Other Ambulatory Visit: Payer: Self-pay | Admitting: Physician Assistant

## 2019-09-20 MED ORDER — TESTOSTERONE CYPIONATE 200 MG/ML IM SOLN: INTRAMUSCULAR | 2 refills | Status: DC

## 2019-09-23 ENCOUNTER — Other Ambulatory Visit: Payer: Self-pay | Admitting: Internal Medicine

## 2019-09-23 MED ORDER — TESTOSTERONE CYPIONATE 200 MG/ML IM SOLN: INTRAMUSCULAR | 2 refills | Status: DC

## 2019-09-23 NOTE — Telephone Encounter (Signed)
-----   Message from Gregery Na, CMA sent at 09/23/2019 12:05 PM EDT ----- Regarding: med request Contact: [phone] Pharmacy says he lost his testosterone wants to know if they can fill it early due to this issue.   Please & thank you

## 2019-09-24 ENCOUNTER — Telehealth: Payer: Self-pay | Admitting: *Deleted

## 2019-09-24 NOTE — Telephone Encounter (Signed)
Pharmacy called and asked about filling the patient's Testosterone Cypionate 3 days early. OK to refill and Dr Oneta Rack aware.

## 2019-09-29 ENCOUNTER — Other Ambulatory Visit: Payer: Self-pay

## 2019-09-29 DIAGNOSIS — Z79899 Other long term (current) drug therapy: Secondary | ICD-10-CM | POA: Diagnosis not present

## 2019-09-29 LAB — COMPLETE METABOLIC PANEL WITH GFR
AG Ratio: 2 (calc) (ref 1.0–2.5)
ALT: 26 U/L (ref 9–46)
AST: 19 U/L (ref 10–40)
Albumin: 4.9 g/dL (ref 3.6–5.1)
Alkaline phosphatase (APISO): 39 U/L (ref 36–130)
BUN: 19 mg/dL (ref 7–25)
CO2: 28 mmol/L (ref 20–32)
Calcium: 10.1 mg/dL (ref 8.6–10.3)
Chloride: 101 mmol/L (ref 98–110)
Creat: 1.35 mg/dL (ref 0.60–1.35)
GFR, Est African American: 76 mL/min/{1.73_m2} (ref >=60)
GFR, Est Non African American: 65 mL/min/{1.73_m2} (ref >=60)
Globulin: 2.4 g/dL (calc) (ref 1.9–3.7)
Glucose, Bld: 88 mg/dL (ref 65–99)
Potassium: 4.5 mmol/L (ref 3.5–5.3)
Sodium: 140 mmol/L (ref 135–146)
Total Bilirubin: 0.4 mg/dL (ref 0.2–1.2)
Total Protein: 7.3 g/dL (ref 6.1–8.1)

## 2019-09-29 LAB — CBC WITH DIFFERENTIAL/PLATELET
Absolute Monocytes: 708 cells/uL (ref 200–950)
Basophils Absolute: 64 cells/uL (ref 0–200)
Basophils Relative: 0.7 %
Eosinophils Absolute: 74 cells/uL (ref 15–500)
Eosinophils Relative: 0.8 %
HCT: 50.3 % — ABNORMAL HIGH (ref 38.5–50.0)
Hemoglobin: 17.2 g/dL — ABNORMAL HIGH (ref 13.2–17.1)
Lymphs Abs: 2254 cells/uL (ref 850–3900)
MCH: 30.7 pg (ref 27.0–33.0)
MCHC: 34.2 g/dL (ref 32.0–36.0)
MCV: 89.8 fL (ref 80.0–100.0)
MPV: 10.4 fL (ref 7.5–12.5)
Monocytes Relative: 7.7 %
Neutro Abs: 6100 cells/uL (ref 1500–7800)
Neutrophils Relative %: 66.3 %
Platelets: 319 10*3/uL (ref 140–400)
RBC: 5.6 10*6/uL (ref 4.20–5.80)
RDW: 13 % (ref 11.0–15.0)
Total Lymphocyte: 24.5 %
WBC: 9.2 10*3/uL (ref 3.8–10.8)

## 2019-09-30 ENCOUNTER — Other Ambulatory Visit: Payer: Self-pay | Admitting: Internal Medicine

## 2019-09-30 ENCOUNTER — Other Ambulatory Visit: Payer: Self-pay | Admitting: Adult Health

## 2019-09-30 DIAGNOSIS — I10 Essential (primary) hypertension: Secondary | ICD-10-CM

## 2019-09-30 DIAGNOSIS — E038 Other specified hypothyroidism: Secondary | ICD-10-CM

## 2019-09-30 MED ORDER — LEVOTHYROXINE SODIUM 75 MCG PO TABS: ORAL_TABLET | ORAL | 0 refills | Status: DC

## 2019-09-30 MED ORDER — LISINOPRIL 40 MG PO TABS: ORAL_TABLET | ORAL | 0 refills | Status: DC

## 2019-10-31 NOTE — Progress Notes (Signed)
Follow up  Anxiety -     clonazePAM (KLONOPIN) 1 MG tablet; Take 1 tablet (1 mg total) by mouth 2 (two) times daily as needed for anxiety. Suggest looking into Vrylar or seeing pysch, will do very low dose klonopin- patient understands we can not do long term - suggest sleep study  Class 2 severe obesity due to excess calories with serious comorbidity and body mass index (BMI) of 35.0 to 35.9 in adult [facility]) - follow up 3 months for progress monitoring - increase veggies, decrease carbs - long discussion about weight loss, diet, and exercise Suggest sleep study- declines  Hypothyroidism due to Hashimoto's thyroiditis Continue meds  B12 deficiency Continue meds  Vitamin D deficiency -     Vitamin D, Ergocalciferol, (DRISDOL) 1.25 MG (50000 UNIT) CAPS capsule; TAKE 1 CAPSULE BY MOUTH EVERY WEEK  Hypercholesteremia -     Lipid panel  Medication management -     CBC with Differential/Platelet -     COMPLETE METABOLIC PANEL WITH GFR -     CBC with Differential/Platelet -     COMPLETE METABOLIC PANEL WITH GFR     38 y.o. 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   He is eating better, and on his medications and states he is feeling better.  He does state he has good days and bad days, he is tearful and states he sometimes does not want to get out of bed but uses CBT methods to help him try to see the positive side. He denies SI/HI. Declines referral or medications, has been on several bipolar meds in the past and states that he does not like how they make him feel.  He was given valium 2mg  only 10 pills, and states that it did not help him at all. States he did well with klonopin.   He declines a sleep study.  Negative celiac panel  Negative RPR 2016, denies tick exposure.   BMI is Body mass index is 35.4 kg/m., he is working on diet and exercise. Has lost 10 lbs from May and  states between that and that testosterone he is feeling better.  Wt Readings from Last 3 Encounters:  11/01/19 243 lb 3.2 oz (110.3 kg)  09/01/19 250 lb (113.4 kg)  07/22/19 253 lb (114.8 kg)    He has a history of testosterone deficiency and is on testosterone replacement x 2 shots, last shot was 06/10 or 12th. He states that the testosterone helps with his energy, libido, muscle mass and he has changed his diet helps that help.   Lab Results  Component Value Date   TESTOSTERONE 556 09/01/2019   He is on B12 now.  Lab Results  Component Value Date   VITAMINB12 365 07/22/2019   Lab Results  Component Value Date   IRON 99 07/22/2019   TIBC 299 07/22/2019   Lab Results  Component Value Date   CREATININE 1.35 09/29/2019   BUN 19 09/29/2019   NA 140 09/29/2019   K 4.5 09/29/2019   CL 101 09/29/2019   CO2 28 09/29/2019   Lab Results  Component Value Date   TSH 2.12 07/22/2019   Lab Results  Component Value Date   HGBA1C 5.4 04/13/2018    Blood pressure 120/78, pulse 94, temperature 97.9 F (36.6 C), weight 243 lb 3.2 oz (110.3 kg), SpO2 98 %.  Medications  Current Outpatient Medications (Endocrine & Metabolic):  .  levothyroxine (SYNTHROID) 75 MCG tablet, Take 1 tablet daily on an empty stomach with only water for 30 minutes & no Antacid meds, Calcium or Magnesium for 4 hours & avoid Biotin .  metFORMIN (GLUCOPHAGE XR) 750 MG 24 hr tablet, Take 1 tablet (750 mg total) by mouth 2 (two) times daily with a meal. .  testosterone cypionate (DEPO-TESTOSTERONE) 200 MG/ML injection, Inject 2 cc into the muscle every 2 weeks  Current Outpatient Medications (Cardiovascular):  .  fenofibrate (TRICOR) 145 MG tablet, TAKE ONE TABLET BY MOUTH ONE TIME DAILY  for triglycerides .  lisinopril (ZESTRIL) 40 MG tablet, Take 1 tablet Daily for BP & Diabetic Kidney Protection    Current Outpatient Medications (Hematological):  Marland Kitchen  Cyanocobalamin (B-12) 500 MCG SUBL, One daily  Current  Outpatient Medications (Other):  Marland Kitchen  Ascorbic Acid (VITAMIN C PO), Take by mouth daily. Marland Kitchen  NEEDLE, DISP, 21 G 21G X 1" MISC, Use for testosterone injections, 3 ml .  NEEDLE, DISP, 25 G 25G X 1" MISC, Use IM every 1-2 weeks for testosterone injection .  phentermine (ADIPEX-P) 37.5 MG tablet, Take 1 tablet (37.5 mg total) by mouth daily before breakfast. .  Vitamin D, Ergocalciferol, (DRISDOL) 1.25 MG (50000 UNIT) CAPS capsule, TAKE 1 CAPSULE BY MOUTH EVERY WEEK .  VITAMIN E PO, Take by mouth daily. Marland Kitchen  zinc gluconate 50 MG tablet, Take 1 tablet (50 mg total) by mouth daily. .  diazepam (VALIUM) 2 MG tablet, Take 1 tablet (2 mg total) by mouth daily as needed for anxiety (do not take daily). (Patient not taking: Reported on 11/01/2019)  Problem list He has Hypertension; Hypothyroidism; Smoker; Class 2 severe obesity due to excess calories with serious comorbidity and body mass index (BMI) of 35.0 to 35.9 in adult [facility]); Vitamin D deficiency; Eczema; History of migraine; Hypercholesteremia; B12 deficiency; Medication management; and Hypogonadism in male on their problem list.   Review of Systems  Constitutional: Negative for chills, diaphoresis and fever.  HENT: Negative.   Eyes: Negative.   Respiratory: Negative.   Cardiovascular: Negative.   Gastrointestinal: Negative.   Genitourinary: Negative.   Musculoskeletal: Negative.   Skin: Negative.     Physical Exam General Appearance: Well nourished, in no apparent distress.  Eyes: PERRLA, EOMs, conjunctiva no swelling or erythema, normal fundi and vessels.  Sinuses: No Frontal/maxillary tenderness  ENT/Mouth: Ext aud canals clear, normal light reflex with TMs without erythema, bulging. Good dentition. No erythema, swelling, or exudate on post pharynx. Tonsils not swollen or erythematous. Hearing normal.  Neck: Supple, thyroid normal. No bruits  Respiratory: Respiratory effort normal, BS equal bilaterally without rales, rhonchi, wheezing or  stridor.  Cardio: RRR without murmurs, rubs or gallops. Brisk peripheral pulses without edema.  Chest: symmetric, with normal excursions and percussion.  Abdomen: Soft, nontender, no guarding, rebound, hernias, masses, or organomegaly.  Lymphatics: Non tender without lymphadenopathy.  Musculoskeletal: Full ROM all peripheral extremities,5/5 strength, and normal gait.  Skin: Warm, dry without rashes, lesions, ecchymosis. Neuro: Cranial nerves intact, reflexes equal bilaterally. Normal muscle tone, no cerebellar symptoms. Sensation intact.  Psych: Awake and oriented X 3, normal affect, Insight and Judgment appropriate.     Future Appointments  Date Time Provider Department Center  08/02/2020  3:00 PM Quentin Mulling, PA-C [facility] None

## 2019-11-01 ENCOUNTER — Other Ambulatory Visit: Payer: Self-pay

## 2019-11-01 ENCOUNTER — Ambulatory Visit: Payer: PRIVATE HEALTH INSURANCE | Admitting: Physician Assistant

## 2019-11-01 VITALS — BP 120/78 | HR 94 | Temp 97.9°F | Wt 243.2 lb

## 2019-11-01 DIAGNOSIS — Z79899 Other long term (current) drug therapy: Secondary | ICD-10-CM | POA: Diagnosis not present

## 2019-11-01 DIAGNOSIS — F17200 Nicotine dependence, unspecified, uncomplicated: Secondary | ICD-10-CM

## 2019-11-01 DIAGNOSIS — E291 Testicular hypofunction: Secondary | ICD-10-CM

## 2019-11-01 DIAGNOSIS — E559 Vitamin D deficiency, unspecified: Secondary | ICD-10-CM

## 2019-11-01 DIAGNOSIS — E538 Deficiency of other specified B group vitamins: Secondary | ICD-10-CM | POA: Diagnosis not present

## 2019-11-01 DIAGNOSIS — E063 Autoimmune thyroiditis: Secondary | ICD-10-CM

## 2019-11-01 DIAGNOSIS — F419 Anxiety disorder, unspecified: Secondary | ICD-10-CM

## 2019-11-01 DIAGNOSIS — E7800 Pure hypercholesterolemia, unspecified: Secondary | ICD-10-CM

## 2019-11-01 DIAGNOSIS — Z6835 Body mass index (BMI) 35.0-35.9, adult: Secondary | ICD-10-CM

## 2019-11-01 DIAGNOSIS — I10 Essential (primary) hypertension: Secondary | ICD-10-CM

## 2019-11-01 DIAGNOSIS — E038 Other specified hypothyroidism: Secondary | ICD-10-CM

## 2019-11-01 MED ORDER — CLONAZEPAM 1 MG PO TABS: 1.0000 mg | ORAL_TABLET | Freq: Two times a day (BID) | ORAL | 0 refills | Status: DC | PRN

## 2019-11-01 NOTE — Patient Instructions (Addendum)
Check out Vrylar for bioplar depression  GENERAL HEALTH GOALS  Know what a healthy weight is for you (roughly BMI <25) and aim to maintain this  Aim for 7+ servings of fruits and vegetables daily  70-80+ fluid ounces of water or unsweet tea for healthy kidneys  Limit to max 1 drink of alcohol per day; avoid smoking/tobacco  Limit animal fats in diet for cholesterol and heart health - choose grass fed whenever available  Avoid highly processed foods, and foods high in saturated/trans fats  Aim for low stress - take time to unwind and care for your mental health  Aim for 150 min of moderate intensity exercise weekly for heart health, and weights twice weekly for bone health  Aim for 7-9 hours of sleep daily    General eating tips  What to Avoid  Avoid added sugars o Often added sugar can be found in processed foods such as many condiments, dry cereals, cakes, cookies, chips, crisps, crackers, candies, sweetened drinks, etc.  o Read labels and AVOID/DECREASE use of foods with the following in their ingredient list: Sugar, fructose, high fructose corn syrup, sucrose, glucose, maltose, dextrose, molasses, cane sugar, [place] sugar, any type of syrup, agave nectar, etc.    Avoid snacking in between meals- drink water or if you feel you need a snack, pick a high water content snack such as cucumbers, watermelon, or any veggie.   Avoid foods made with flour o If you are going to eat food made with flour, choose those made with whole-grains; and, minimize your consumption as much as is tolerable  Avoid processed foods o These foods are generally stocked in the middle of the grocery store.  o Focus on shopping on the perimeter of the grocery.  What to Include  Vegetables o GREEN LEAFY VEGETABLES: Kale, spinach, mustard greens, collard greens, cabbage, broccoli, etc. o OTHER: Asparagus, cauliflower, eggplant, carrots, peas, Brussel sprouts, tomatoes, bell peppers, zucchini, beets,  cucumbers, etc.  Grains, seeds, and legumes o Beans: kidney beans, black eyed peas, garbanzo beans, black beans, pinto beans, etc. o Whole, unrefined grains: Isa rice, barley, bulgur, oatmeal, etc.  Healthy fats  o Avoid highly processed fats such as vegetable oil o Examples of healthy fats: avocado, olives, virgin olive oil, dark chocolate (?72% Cocoa), nuts (peanuts, almonds, walnuts, cashews, pecans, etc.) o Please still do small amount of these healthy fats, they are dense in calories.   Low - Moderate Intake of Animal Sources of Protein o Meat sources: chicken, [place], salmon, tuna. Limit to 4 ounces of meat at one time or the size of your palm. o Consider limiting dairy sources, but when choosing dairy focus on: PLAIN [place] yogurt, cottage cheese, high-protein milk  Fruit o Choose berries    Salivary Stone  A salivary stone is a mineral deposit that builds up in the ducts that drain your salivary glands. Most salivary stones are made of calcium. When a stone forms, saliva can back up into the gland and cause painful swelling. Your salivary glands are the glands that produce saliva. You have six major salivary glands. Each gland has a duct that carries saliva into your mouth. Saliva keeps your mouth moist and breaks down the food that you eat. It also helps prevent tooth decay. Two salivary glands are located just in front of your ears (parotid). The ducts for these glands open up inside your cheeks, near your back teeth. You also have two glands under your tongue (sublingual) and two glands  under your jaw (submandibular). The ducts for these glands open under your tongue. A stone can form in any salivary gland. The most common place for a salivary stone to develop is in a submandibular salivary gland. What are the causes? Salivary stones may be caused by any condition that reduces the flow of saliva. It is not known why some people form stones. What increases the risk? You are more  likely to develop this condition if:  You are male.  You do not drink enough water.  You smoke.  You have any of the following: ? High blood pressure. ? Gout. ? Diabetes. What are the signs or symptoms? The main sign of a salivary gland stone is sudden swelling of a salivary gland when eating. This usually happens under the jaw on one side. Other signs and symptoms may include:  Swelling of the cheek or under the tongue when eating.  Pain in the swollen area.  Trouble chewing or swallowing.  Swelling that goes down after eating. How is this diagnosed? This condition may be diagnosed based on:  Your signs and symptoms.  A physical exam. In many cases, your health care provider will be able to feel the stone in a duct inside your mouth.  Imaging studies, such as: ? X-rays. ? Ultrasound. ? CT scan. ? MRI. You may need to see an ear, nose, and throat specialist (ENT or otolaryngologist) for diagnosis and treatment. How is this treated? Treatment for this condition depends on the size of the stone.  A small stone that is not causing symptoms may be treated with home care.  For a stone that is large enough to cause symptoms, the treatment options may include: ? Probing and widening of the duct to allow the stone to pass. ? Inserting a thin, flexible scope (endoscope) into the duct to locate and remove the stone. ? Breaking up the stone with sound waves. ? Removing the entire salivary gland. Follow these instructions at home:  To relieve discomfort  Follow these instructions every few hours: ? Suck on a lemon candy to stimulate the flow of saliva. ? Put a warm compress over the gland. ? Gently massage the gland. General instructions  Drink enough fluid to keep your urine pale yellow.  Do not use any products that contain nicotine or tobacco, such as cigarettes and e-cigarettes. If you need help quitting, ask your health care provider.  Keep all follow-up visits as  told by your health care provider. This is important. Contact a health care provider if:  You have pain and swelling in your face, jaw, or mouth after eating.  You have persistent swelling in any of these places: ? In front of your ear. ? Under your jaw. ? Inside your mouth. Get help right away if:  You have pain and swelling in your face, jaw, or mouth, and this is getting worse.  Your pain and swelling make it hard to swallow or breathe. Summary  A salivary stone is a mineral deposit that builds up in the ducts that drain your salivary glands.  When a stone forms, saliva can back up into the gland and cause painful swelling.  Salivary stones may be caused by any condition that reduces the flow of saliva.  Treatment for this condition depends on the size of the stone. This information is not intended to replace advice given to you by your health care provider. Make sure you discuss any questions you have with your health care provider.  Document Revised: 04/07/2017 Document Reviewed: 04/07/2017 Elsevier Patient Education  2020 [employer].

## 2019-11-02 LAB — CBC WITH DIFFERENTIAL/PLATELET
Absolute Monocytes: 747 cells/uL (ref 200–950)
Basophils Absolute: 58 cells/uL (ref 0–200)
Basophils Relative: 0.6 %
Eosinophils Absolute: 78 cells/uL (ref 15–500)
Eosinophils Relative: 0.8 %
HCT: 50.8 % — ABNORMAL HIGH (ref 38.5–50.0)
Hemoglobin: 17.3 g/dL — ABNORMAL HIGH (ref 13.2–17.1)
Lymphs Abs: 2318 cells/uL (ref 850–3900)
MCH: 30.2 pg (ref 27.0–33.0)
MCHC: 34.1 g/dL (ref 32.0–36.0)
MCV: 88.8 fL (ref 80.0–100.0)
MPV: 9.6 fL (ref 7.5–12.5)
Monocytes Relative: 7.7 %
Neutro Abs: 6499 cells/uL (ref 1500–7800)
Neutrophils Relative %: 67 %
Platelets: 332 10*3/uL (ref 140–400)
RBC: 5.72 10*6/uL (ref 4.20–5.80)
RDW: 12.9 % (ref 11.0–15.0)
Total Lymphocyte: 23.9 %
WBC: 9.7 10*3/uL (ref 3.8–10.8)

## 2019-11-02 LAB — COMPLETE METABOLIC PANEL WITH GFR
AG Ratio: 1.8 (calc) (ref 1.0–2.5)
ALT: 29 U/L (ref 9–46)
AST: 19 U/L (ref 10–40)
Albumin: 4.9 g/dL (ref 3.6–5.1)
Alkaline phosphatase (APISO): 45 U/L (ref 36–130)
BUN: 19 mg/dL (ref 7–25)
CO2: 29 mmol/L (ref 20–32)
Calcium: 10.1 mg/dL (ref 8.6–10.3)
Chloride: 101 mmol/L (ref 98–110)
Creat: 1.21 mg/dL (ref 0.60–1.35)
GFR, Est African American: 86 mL/min/{1.73_m2} (ref >=60)
GFR, Est Non African American: 74 mL/min/{1.73_m2} (ref >=60)
Globulin: 2.8 g/dL (calc) (ref 1.9–3.7)
Glucose, Bld: 76 mg/dL (ref 65–99)
Potassium: 4 mmol/L (ref 3.5–5.3)
Sodium: 138 mmol/L (ref 135–146)
Total Bilirubin: 0.4 mg/dL (ref 0.2–1.2)
Total Protein: 7.7 g/dL (ref 6.1–8.1)

## 2019-11-02 LAB — LIPID PANEL
Cholesterol: 184 mg/dL (ref <200)
HDL: 30 mg/dL — ABNORMAL LOW (ref >=40)
LDL Cholesterol (Calc): 110 mg/dL (calc) — ABNORMAL HIGH
Non-HDL Cholesterol (Calc): 154 mg/dL (calc) — ABNORMAL HIGH (ref <130)
Total CHOL/HDL Ratio: 6.1 (calc) — ABNORMAL HIGH (ref <5.0)
Triglycerides: 331 mg/dL — ABNORMAL HIGH (ref <150)

## 2019-11-02 LAB — TSH: TSH: 3.05 mIU/L (ref 0.40–4.50)

## 2019-11-02 LAB — VITAMIN D 25 HYDROXY (VIT D DEFICIENCY, FRACTURES): Vit D, 25-Hydroxy: 56 ng/mL (ref 30–100)

## 2019-11-02 LAB — MAGNESIUM: Magnesium: 2.2 mg/dL (ref 1.5–2.5)

## 2019-11-02 LAB — VITAMIN B12: Vitamin B-12: 694 pg/mL (ref 200–1100)

## 2019-12-07 ENCOUNTER — Other Ambulatory Visit: Payer: Self-pay | Admitting: Physician Assistant

## 2019-12-25 ENCOUNTER — Other Ambulatory Visit: Payer: Self-pay | Admitting: Internal Medicine

## 2019-12-25 DIAGNOSIS — I10 Essential (primary) hypertension: Secondary | ICD-10-CM

## 2019-12-28 ENCOUNTER — Other Ambulatory Visit: Payer: Self-pay | Admitting: Internal Medicine

## 2019-12-28 ENCOUNTER — Encounter: Payer: Self-pay | Admitting: Adult Health Nurse Practitioner

## 2019-12-28 ENCOUNTER — Other Ambulatory Visit: Payer: Self-pay

## 2019-12-28 ENCOUNTER — Ambulatory Visit: Payer: PRIVATE HEALTH INSURANCE | Admitting: Adult Health Nurse Practitioner

## 2019-12-28 VITALS — BP 122/86 | HR 100 | Temp 97.7°F | Wt 238.0 lb

## 2019-12-28 DIAGNOSIS — I10 Essential (primary) hypertension: Secondary | ICD-10-CM | POA: Diagnosis not present

## 2019-12-28 DIAGNOSIS — Z202 Contact with and (suspected) exposure to infections with a predominantly sexual mode of transmission: Secondary | ICD-10-CM | POA: Diagnosis not present

## 2019-12-28 DIAGNOSIS — Z113 Encounter for screening for infections with a predominantly sexual mode of transmission: Secondary | ICD-10-CM

## 2019-12-28 DIAGNOSIS — E038 Other specified hypothyroidism: Secondary | ICD-10-CM

## 2019-12-28 MED ORDER — PENICILLIN G BENZATHINE 2400000 UNIT/4ML IM SUSP: 4.0000 mL | Freq: Once | INTRAMUSCULAR | 0 refills | Status: AC

## 2019-12-28 NOTE — Progress Notes (Signed)
Assessment and Plan:  Scott Sutton was seen today for acute visit.  Diagnoses and all orders for this visit:  Exposure to syphilis -     Penicillin G Benzathine 2400000 UNIT/4ML SUSP; Inject 4 mLs (2,400,000 Units total) into the muscle once for 1 dose. ADDENDUM:Cost is too expensive for patient.  Discussed treatment via the health department and agrees to make an appointment for this.  Screening for STD (sexually transmitted disease) -     HIV Antibody (routine testing w rflx) -     RPR -     C. trachomatis/N. gonorrhoeae RNA -     HSV(herpes simplex vrs) 1+2 ab-IgG  Essential hypertension Continue current medications: lisinopril 40mg  Monitor blood pressure at home; call if consistently over 130/80 Continue DASH diet.   Reminder to go to the ER if any CP, SOB, nausea, dizziness, severe HA, changes vision/speech, left arm numbness and tingling and jaw pain.     Further disposition pending results of labs. Discussed med's effects and SE's.   Over 30 minutes of face to face exam, counseling, chart review, and critical decision making was performed.   Future Appointments  Date Time Provider Department Center  02/09/2020  3:30 PM 14/03/2019, NP [facility] None  08/02/2020  3:00 PM Scott Sutton, 08/04/2020, NP [facility] None    ------------------------------------------------------------------------------------------------------------------   HPI 39 y.o.male presents for evaluation of screening for STD's.  Reports his partner was recently diagnosis with syphilis, asymptomatic.  He is receiving treatment via the Health Department.  Patient preferred to come here, primary, for evaluation and treatment.  Patient reports he is sexually active, monogomus.  He is asymptomatic.  Discussed testing and treatment regardless of testing results.  Patient is agreeable to plan of care.      Past Medical History:  Diagnosis Date   Hypertension    Hypothyroidism      No Known  Allergies  Current Outpatient Medications on File Prior to Visit  Medication Sig   Ascorbic Acid (VITAMIN C PO) Take by mouth daily.   clonazePAM (KLONOPIN) 1 MG tablet Take 1 tablet (1 mg total) by mouth 2 (two) times daily as needed for anxiety.   Cyanocobalamin (B-12) 500 MCG SUBL One daily   fenofibrate (TRICOR) 145 MG tablet TAKE ONE TABLET BY MOUTH ONE TIME DAILY  for triglycerides   lisinopril (ZESTRIL) 40 MG tablet Take      1 tablet     Daily       for  BP & Diabetic Kidney Protection   metFORMIN (GLUCOPHAGE XR) 750 MG 24 hr tablet Take 1 tablet (750 mg total) by mouth 2 (two) times daily with a meal.   NEEDLE, DISP, 21 G 21G X 1" MISC Use for testosterone injections, 3 ml   NEEDLE, DISP, 25 G 25G X 1" MISC Use IM every 1-2 weeks for testosterone injection   phentermine (ADIPEX-P) 37.5 MG tablet Take     1 tablet      every Morning       for Dieting & Weight Loss   testosterone cypionate (DEPO-TESTOSTERONE) 200 MG/ML injection Inject 2 cc into the muscle every 2 weeks   Vitamin D, Ergocalciferol, (DRISDOL) 1.25 MG (50000 UNIT) CAPS capsule TAKE 1 CAPSULE BY MOUTH EVERY WEEK   VITAMIN E PO Take by mouth daily.   zinc gluconate 50 MG tablet Take 1 tablet (50 mg total) by mouth daily.   No current facility-administered medications on file prior to visit.    ROS: all negative except  above.   Physical Exam:  BP 122/86    Pulse 100    Temp 97.7 F (36.5 C)    Wt 238 lb (108 kg)    SpO2 98%    BMI 34.64 kg/m   General Appearance: Well nourished, in no apparent distress. Eyes: PERRLA, EOMs, conjunctiva no swelling or erythema Sinuses: No Frontal/maxillary tenderness ENT/Mouth: Ext aud canals clear, TMs without erythema, bulging. No erythema, swelling, or exudate on post pharynx.  Tonsils not swollen or erythematous. Hearing normal.  Neck: Supple, thyroid normal.  Respiratory: Respiratory effort normal, BS equal bilaterally without rales, rhonchi, wheezing or stridor.   Cardio: RRR with no MRGs. Brisk peripheral pulses without edema.  Abdomen: Soft, + BS.  Non tender, no guarding, rebound, hernias, masses. Lymphatics: Non tender without lymphadenopathy.  Musculoskeletal: Full ROM, 5/5 strength, normal gait.  Skin: Warm, dry without rashes, lesions, ecchymosis.  Neuro: Cranial nerves intact. Normal muscle tone, no cerebellar symptoms. Sensation intact.  Psych: Awake and oriented X 3, normal affect, Insight and Judgment appropriate.     Elder Negus, NP 1:51 PM [facility]

## 2019-12-29 LAB — HSV(HERPES SIMPLEX VRS) I + II AB-IGG
HSV 1 IGG,TYPE SPECIFIC AB: <0.9 {index}
HSV 2 IGG,TYPE SPECIFIC AB: <0.9 index

## 2019-12-29 LAB — HIV ANTIBODY (ROUTINE TESTING W REFLEX): HIV 1&2 Ab, 4th Generation: NONREACTIVE

## 2019-12-29 LAB — RPR: RPR Ser Ql: NONREACTIVE

## 2019-12-29 LAB — C. TRACHOMATIS/N. GONORRHOEAE RNA
C. trachomatis RNA, TMA: NOT DETECTED
N. gonorrhoeae RNA, TMA: NOT DETECTED

## 2020-02-09 ENCOUNTER — Ambulatory Visit: Payer: PRIVATE HEALTH INSURANCE | Admitting: Adult Health Nurse Practitioner

## 2020-03-22 ENCOUNTER — Telehealth: Payer: Self-pay

## 2020-03-22 NOTE — Telephone Encounter (Signed)
LVM to inform the patient that BROOKE with the Health Dept & wanted his LAB RPR results & if it was ok to give her this information. No results given by me at this time until speaking with patient.

## 2020-04-07 ENCOUNTER — Other Ambulatory Visit: Payer: Self-pay

## 2020-04-07 DIAGNOSIS — E063 Autoimmune thyroiditis: Secondary | ICD-10-CM

## 2020-04-07 DIAGNOSIS — E038 Other specified hypothyroidism: Secondary | ICD-10-CM

## 2020-04-07 MED ORDER — LEVOTHYROXINE SODIUM 75 MCG PO TABS: ORAL_TABLET | ORAL | 0 refills | Status: DC

## 2020-04-19 ENCOUNTER — Encounter: Payer: Self-pay | Admitting: Adult Health Nurse Practitioner

## 2020-04-19 ENCOUNTER — Other Ambulatory Visit: Payer: Self-pay

## 2020-04-19 ENCOUNTER — Ambulatory Visit: Payer: PRIVATE HEALTH INSURANCE | Admitting: Adult Health Nurse Practitioner

## 2020-04-19 VITALS — BP 134/80 | HR 101 | Temp 98.1°F | Wt 241.0 lb

## 2020-04-19 DIAGNOSIS — E038 Other specified hypothyroidism: Secondary | ICD-10-CM

## 2020-04-19 DIAGNOSIS — I10 Essential (primary) hypertension: Secondary | ICD-10-CM

## 2020-04-19 DIAGNOSIS — F419 Anxiety disorder, unspecified: Secondary | ICD-10-CM

## 2020-04-19 DIAGNOSIS — E063 Autoimmune thyroiditis: Secondary | ICD-10-CM

## 2020-04-19 MED ORDER — CLONAZEPAM 1 MG PO TABS: 1.0000 mg | ORAL_TABLET | Freq: Two times a day (BID) | ORAL | 0 refills | Status: DC | PRN

## 2020-04-19 NOTE — Patient Instructions (Signed)
   Try Fluticasone (Flonase) once daily   Sudfed (pseudoephedrine) Nasal decongestant Take one tablet every 6 hours as needed Check packaging instructions IF you take blood pressure medication, this medication can raise your blood pressure. Monitor your blood pressure and if elevation occurs, stop medication. You can get this at any pharmacy, will need your drivers license to purchase this  Saline Nasal Spray: You can get this at any pharmacy Use as directed on package This will help to sooth inside of your nose from irritation  [facility] Sinus Rinse / Neti Pot Use warm bottled or distilled water DO NOT use tap water! Use twice a day as needed This will help to sooth irritated sinuses and clear nasal congestion If using nasal sprays, do so after completing this.

## 2020-04-19 NOTE — Progress Notes (Signed)
Follow up  Hypogonadism in male -     Testosterone - sent in smaller needles  Class 2 severe obesity due to excess calories with serious comorbidity and body mass index (BMI) of 35.0 to 35.9 in adult [facility]) - follow up 3 months for progress monitoring - increase veggies, decrease carbs - long discussion about weight loss, diet, and exercise Suggest sleep study- declines  Hypothyroidism due to Hashimoto's thyroiditis Continue meds  B12 deficiency Continue meds  Vitamin D deficiency -     Vitamin D, Ergocalciferol, (DRISDOL) 1.25 MG (50000 UNIT) CAPS capsule; TAKE 1 CAPSULE BY MOUTH EVERY WEEK  Hypercholesteremia -     Lipid panel  Medication management -     CBC with Differential/Platelet -     COMPLETE METABOLIC PANEL WITH GFR -     CBC with Differential/Platelet -     COMPLETE METABOLIC PANEL WITH GFR     40 y.o.  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  He also has anxiety and testosterone deficiency.  Report he had COVID19 in 12/2019.  Lost taste & smell, has returned some but muted.  He reports since then he has had significant increase in nasal mucus. He has not tried any products for this.  Reports continued fatigue, also complaint in the past.  Last OV he was seen for syphilis exposure.  Discussed guidelines for this, prescription medication was too expensive.  Recommended treatment for exposure with [facility], he did not follow up with this.  Encouraged this treatment.   From last OV 6 month ago. He complained that he is always felt tired.  He is eating better, and on his medications and states he is feeling better.  He does state he has good days and bad days, he is tearful and states he sometimes does not want to get out of bed but uses CBT methods to help him try to see the positive side. He denies SI/HI. Declines referral or medications, has been on several bipolar meds in the past and  states that he does not like how they make him feel.  He declines a sleep study.  Negative celiac panel  Negative RPR 2016, denies tick exposure.   He has a history of testosterone deficiency and is on testosterone replacement x 2 shots, last shot was 06/10 or 12th. He states that the testosterone helps with his energy, libido, muscle mass and he has changed his diet helps that help.  Defer lab today. Lab Results  Component Value Date   TESTOSTERONE 556 09/01/2019   He is on B12 now.  Lab Results  Component Value Date   VITAMINB12 694 11/01/2019   Lab Results  Component Value Date   IRON 99 07/22/2019   TIBC 299 07/22/2019   Lab Results  Component Value Date   CREATININE 1.21 11/01/2019   BUN 19 11/01/2019   NA 138 11/01/2019   K 4.0 11/01/2019   CL 101 11/01/2019   CO2 29 11/01/2019   Lab Results  Component Value Date   TSH 3.05 11/01/2019   BMI is Body mass index is 35.08 kg/m., he is working on diet and exercise. Wt Readings from Last 3 Encounters:  04/19/20 241 lb (109.3 kg)  12/28/19 238 lb (108 kg)  11/01/19 243 lb 3.2 oz (110.3 kg)   Blood pressure 134/80, pulse (!) 101, temperature 98.1 F (36.7 C), weight 241 lb (109.3 kg), SpO2 98 %.  Medications  Current Outpatient Medications (Endocrine & Metabolic):  .  levothyroxine (SYNTHROID) 75 MCG tablet, Take  1 tablet  Daily  on an empty stomach with only water for 30 minutes & no Antacid meds, Calcium or Magnesium for 4 hours & avoid Biotin .  metFORMIN (GLUCOPHAGE XR) 750 MG 24 hr tablet, Take 1 tablet (750 mg total) by mouth 2 (two) times daily with a meal. .  testosterone cypionate (DEPO-TESTOSTERONE) 200 MG/ML injection, Inject 2 cc into the muscle every 2 weeks  Current Outpatient Medications (Cardiovascular):  .  fenofibrate (TRICOR) 145 MG tablet, TAKE ONE TABLET BY MOUTH ONE TIME DAILY  for triglycerides .  lisinopril (ZESTRIL) 40 MG tablet, Take      1 tablet     Daily       for  BP & Diabetic Kidney  Protection    Current Outpatient Medications (Hematological):  Marland Kitchen  Cyanocobalamin (B-12) 500 MCG SUBL, One daily  Current Outpatient Medications (Other):  Marland Kitchen  Ascorbic Acid (VITAMIN C PO), Take by mouth daily. .  clonazePAM (KLONOPIN) 1 MG tablet, Take 1 tablet (1 mg total) by mouth 2 (two) times daily as needed for anxiety. Marland Kitchen  NEEDLE, DISP, 21 G 21G X 1" MISC, Use for testosterone injections, 3 ml .  NEEDLE, DISP, 25 G 25G X 1" MISC, Use IM every 1-2 weeks for testosterone injection .  phentermine (ADIPEX-P) 37.5 MG tablet, Take     1 tablet      every Morning       for Dieting & Weight Loss .  Vitamin D, Ergocalciferol, (DRISDOL) 1.25 MG (50000 UNIT) CAPS capsule, TAKE 1 CAPSULE BY MOUTH EVERY WEEK .  VITAMIN E PO, Take by mouth daily. Marland Kitchen  zinc gluconate 50 MG tablet, Take 1 tablet (50 mg total) by mouth daily.  Problem list He has Hypertension; Hypothyroidism; Smoker; Class 2 severe obesity due to excess calories with serious comorbidity and body mass index (BMI) of 35.0 to 35.9 in adult [facility]); Vitamin D deficiency; Eczema; History of migraine; Hypercholesteremia; B12 deficiency; Medication management; and Hypogonadism in male on their problem list.   Review of Systems  Constitutional: Negative for chills, diaphoresis and fever.  HENT: Negative.   Eyes: Negative.   Respiratory: Negative.   Cardiovascular: Negative.   Gastrointestinal: Negative.   Genitourinary: Negative.   Musculoskeletal: Negative.   Skin: Negative.     Physical Exam General Appearance: Well nourished, in no apparent distress.  Eyes: PERRLA, EOMs, conjunctiva no swelling or erythema, normal fundi and vessels.  Sinuses: No Frontal/maxillary tenderness  ENT/Mouth: Ext aud canals clear, normal light reflex with TMs without erythema, bulging. Good dentition. No erythema, swelling, or exudate on post pharynx. Tonsils not swollen or erythematous. Hearing normal.  Neck: Supple, thyroid normal. No bruits  Respiratory:  Respiratory effort normal, BS equal bilaterally without rales, rhonchi, wheezing or stridor.  Cardio: RRR without murmurs, rubs or gallops. Brisk peripheral pulses without edema.  Chest: symmetric, with normal excursions and percussion.  Abdomen: Soft, nontender, no guarding, rebound, hernias, masses, or organomegaly.  Lymphatics: Non tender without lymphadenopathy.  Musculoskeletal: Full ROM all peripheral extremities,5/5 strength, and normal gait.  Skin: Warm, dry without rashes, lesions, ecchymosis. Neuro: Cranial nerves intact, reflexes equal bilaterally. Normal muscle tone, no cerebellar symptoms. Sensation intact.  Psych: Awake and oriented X 3, normal affect, Insight and Judgment appropriate.   Elder Negus, Edrick Oh, DNP [facility] 04/19/2020  2:36 PM

## 2020-04-20 LAB — CBC WITH DIFFERENTIAL/PLATELET
Absolute Monocytes: 642 cells/uL (ref 200–950)
Basophils Absolute: 48 cells/uL (ref 0–200)
Basophils Relative: 0.7 %
Eosinophils Absolute: 83 cells/uL (ref 15–500)
Eosinophils Relative: 1.2 %
HCT: 52.9 % — ABNORMAL HIGH (ref 38.5–50.0)
Hemoglobin: 18.1 g/dL — ABNORMAL HIGH (ref 13.2–17.1)
Lymphs Abs: 1470 cells/uL (ref 850–3900)
MCH: 30.2 pg (ref 27.0–33.0)
MCHC: 34.2 g/dL (ref 32.0–36.0)
MCV: 88.2 fL (ref 80.0–100.0)
MPV: 10 fL (ref 7.5–12.5)
Monocytes Relative: 9.3 %
Neutro Abs: 4658 cells/uL (ref 1500–7800)
Neutrophils Relative %: 67.5 %
Platelets: 273 10*3/uL (ref 140–400)
RBC: 6 10*6/uL — ABNORMAL HIGH (ref 4.20–5.80)
RDW: 13.1 % (ref 11.0–15.0)
Total Lymphocyte: 21.3 %
WBC: 6.9 10*3/uL (ref 3.8–10.8)

## 2020-04-20 LAB — COMPLETE METABOLIC PANEL WITH GFR
AG Ratio: 1.6 (calc) (ref 1.0–2.5)
ALT: 27 U/L (ref 9–46)
AST: 18 U/L (ref 10–40)
Albumin: 4.5 g/dL (ref 3.6–5.1)
Alkaline phosphatase (APISO): 40 U/L (ref 36–130)
BUN: 13 mg/dL (ref 7–25)
CO2: 30 mmol/L (ref 20–32)
Calcium: 10.4 mg/dL — ABNORMAL HIGH (ref 8.6–10.3)
Chloride: 101 mmol/L (ref 98–110)
Creat: 1.24 mg/dL (ref 0.60–1.35)
GFR, Est African American: 83 mL/min/{1.73_m2} (ref >=60)
GFR, Est Non African American: 72 mL/min/{1.73_m2} (ref >=60)
Globulin: 2.8 g/dL (calc) (ref 1.9–3.7)
Glucose, Bld: 97 mg/dL (ref 65–99)
Potassium: 4.2 mmol/L (ref 3.5–5.3)
Sodium: 140 mmol/L (ref 135–146)
Total Bilirubin: 0.6 mg/dL (ref 0.2–1.2)
Total Protein: 7.3 g/dL (ref 6.1–8.1)

## 2020-04-20 LAB — TSH: TSH: 2.49 mIU/L (ref 0.40–4.50)

## 2020-04-24 NOTE — Telephone Encounter (Signed)
Health dept has requested LAB results from our office. Message sent to patient to inform pt of this request. LAB results were faxed to health dept. [phone] Attn: Bvooks Shippen-MEW [facility] at 12:23pm on 04/24/2020

## 2020-05-01 ENCOUNTER — Other Ambulatory Visit: Payer: Self-pay | Admitting: Internal Medicine

## 2020-05-01 ENCOUNTER — Other Ambulatory Visit: Payer: Self-pay | Admitting: Adult Health

## 2020-05-01 DIAGNOSIS — E038 Other specified hypothyroidism: Secondary | ICD-10-CM

## 2020-05-01 DIAGNOSIS — E063 Autoimmune thyroiditis: Secondary | ICD-10-CM

## 2020-05-04 ENCOUNTER — Other Ambulatory Visit: Payer: Self-pay | Admitting: Internal Medicine

## 2020-05-04 DIAGNOSIS — E7800 Pure hypercholesterolemia, unspecified: Secondary | ICD-10-CM

## 2020-05-04 MED ORDER — FENOFIBRATE 145 MG PO TABS
ORAL_TABLET | ORAL | 0 refills | Status: DC
Start: 2020-05-04 — End: 2020-11-08

## 2020-05-08 ENCOUNTER — Other Ambulatory Visit: Payer: Self-pay

## 2020-05-08 DIAGNOSIS — I10 Essential (primary) hypertension: Secondary | ICD-10-CM

## 2020-05-08 MED ORDER — TESTOSTERONE CYPIONATE 200 MG/ML IM SOLN: INTRAMUSCULAR | 0 refills | Status: DC

## 2020-05-08 MED ORDER — LISINOPRIL 40 MG PO TABS: ORAL_TABLET | ORAL | 1 refills | Status: DC

## 2020-05-09 ENCOUNTER — Other Ambulatory Visit: Payer: Self-pay

## 2020-05-09 DIAGNOSIS — E559 Vitamin D deficiency, unspecified: Secondary | ICD-10-CM

## 2020-05-09 MED ORDER — VITAMIN D (ERGOCALCIFEROL) 1.25 MG (50000 UNIT) PO CAPS: ORAL_CAPSULE | ORAL | 3 refills | Status: DC

## 2020-05-18 ENCOUNTER — Encounter: Payer: Self-pay | Admitting: Physician Assistant

## 2020-08-01 ENCOUNTER — Encounter: Payer: PRIVATE HEALTH INSURANCE | Admitting: Physician Assistant

## 2020-08-01 DIAGNOSIS — E8881 Metabolic syndrome: Secondary | ICD-10-CM | POA: Insufficient documentation

## 2020-08-01 NOTE — Progress Notes (Signed)
CPE  Assessment and Plan:  Encounter for general adult medical examination with abnormal findings 1 year  Essential hypertension - above goal today, reports well controlled at home - continue lisinopril - DASH diet, exercise and monitor at home. Call if greater than 130/80.  -     COMPLETE METABOLIC PANEL WITH GFR -     Urinalysis, Routine w reflex microscopic  Hypercholesteremia Admitted non-compliant with diet - declines check today Wants to work on lifestyle - check next visit  decrease fatty foods increase activity.  -     Lipid panel - defer per patient   Hypothyroidism due to Hashimoto's thyroiditis Hypothyroidism-check TSH level, continue medications the same, reminded to take on an empty stomach 30-46mins before food.  -     TSH  Other abnormal glucose/insulin resistance  Discussed disease and risks Discussed diet/exercise, weight management        -     A1C - declines - checking CMP for glucose  Morbid obesity (HCC) - BMI 36 with hyperlipidemia, hypothyroid - long discussion about lifestyle, metabolic disease - plant based, minimally processed, low saturated fat, high fiber, mediterranean type diet and benefits reviewed - follow up 6 months for progress monitoring  B12 deficiency -     Vitamin B12 - defer  Medication management -     Magnesium- defer  Vitamin D deficiency Continue supplement -     VITAMIN D 25 Hydroxy (Vit-D Deficiency, Fractures) - defer  Former smoker/ ;current vaper Smoking cessation-  instruction/counseling given, counseled patient on the dangers of tobacco use, advised patient to stop smoking, and reviewed strategies to maximize success  History of migraine Monitor  Eczema, unspecified type .montior - elimination diet Topical steroid PRN-   Polycythemia  Timing coincides with onset of testosterone; typical SE; monitor and decrease dose as needed - check ferritin if doesn't improve with dose adjustment  Testosterone  def Continued perceived benefits with supplementation, no SE. Check level PRN, continue zinc, no symptoms of elevated estrogen or dihydrotestosterone.   Erectile dysfunction Sildenafil works well, take PRN Weight loss encouraged; discussed vascular mechanism  Left testicular mass - US scrotum - phone number given to schedule - stop testosterone if concerning for cancer  Patient is uninsured and cash pay; shared decision making for lab.   Discussed med's effects and SE's. Screening labs and tests as requested with regular follow-up as recommended. Over 40 minutes of exam, counseling, chart review and critical decision making was performed  Future Appointments  Date Time Provider Department Center  02/13/2021 10:30 AM Judd Gaudier, NP [facility] None  08/02/2021  3:00 PM Judd Gaudier, NP [facility] None    HPI 40 y.o. male patient presents for a complete physical. He has Hypertension; Hypothyroidism; Smoker; Morbid obesity (HCC); Vitamin D deficiency; Eczema; History of migraine; Hypercholesteremia; B12 deficiency; Medication management; Hypogonadism in male; Insulin resistance; and Erectile dysfunction on their problem list.   He has male partner, 4 year, declines STD check, had negative check previously. 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   He reports 12-18 months of erectile dysfunction, difficulty getting up and maintaining. Hasn't noted benefit with testosterone supplement. Admittedly poor lifestyle. Not new with BP med. Endorses good relationship prior to onset. Denies pain, abnormal ejaculate. He is concerned about left testicular mass, noted 02/2020 and seems progressive. Intermittently tender.   He has history of depression/anxiety- has been on several agents in the past, prozac, depakote, wellbutrin, xanax, zoloft. States he had been on 40 mg valium in  a day. At one point he was on 5 meds for depression/anxiety at one time.  The SSRI's he was having mood swings. He now uses  lifestyle management/stress techniques and feels. Recently has klonopin 1 mg BID PRN, last refill 30 tabs filled 04/19/2020, still has some left.   He has history of eczema, stable/improved.    Smoker, has been vaping for many years, has reduced down to stick pod, gradually reducing, got to try to quit this year.   BMI is Body mass index is 36 kg/m., he has not been working on diet and exercise, but does try to make better choices, tries to limit starches, eats more fresh fruit/veggies, choose whole grain, etc.  Wt Readings from Last 3 Encounters:  08/02/20 243 lb 12.8 oz (110.6 kg)  04/19/20 241 lb (109.3 kg)  12/28/19 238 lb (108 kg)   His blood pressure has been controlled at home (unsure of numbers but reports machine states "in good range"), reports taking lisinopril 40 mg regularly, has had BP x teens, today their BP is BP: (!) 152/90.  He does not workout. He denies chest pain, shortness of breath, dizziness.   He has been working on diet/exercise for cholesterol, severe trig elevations improved but not controlled on fenofibrate.  Lab Results  Component Value Date   CHOL 184 11/01/2019   HDL 30 (L) 11/01/2019   LDLCALC 110 (H) 11/01/2019   TRIG 331 (H) 11/01/2019   CHOLHDL 6.1 (H) 11/01/2019   He has insulin resistance, has been on metformin 750 mg 1 tab daily. Last A1C in the office was:  Lab Results  Component Value Date   HGBA1C 5.4 04/13/2018   He is on thyroid medication. His medication was not changed last visit. He takes levothyroxine 75 mcg daily.   Lab Results  Component Value Date   TSH 2.49 04/19/2020   Patient is on Vitamin D supplement, taking 62130 IU weekly Lab Results  Component Value Date   VD25OH 14 11/01/2019     He has a history of testosterone deficiency and is on testosterone replacement, he has been taking 400 mg q2w, last injection 07/21/2020, 12 days ago. He states that the testosterone helps with his energy, libido, muscle mass, but denies benefit  with ED. Denies symptoms of elevated estrogen or dihydrotestosterone.  Lab Results  Component Value Date   TESTOSTERONE 556 09/01/2019   He had PSA checked due to ED last year, normal. Denies LUTS.  Lab Results  Component Value Date   PSA 2.0 07/22/2019      Current Medications:  Current Outpatient Medications on File Prior to Visit  Medication Sig Dispense Refill  . Ascorbic Acid (VITAMIN C PO) Take by mouth daily.    . clonazePAM (KLONOPIN) 1 MG tablet Take 1 tablet (1 mg total) by mouth 2 (two) times daily as needed for anxiety. 30 tablet 0  . Cyanocobalamin (B-12) 500 MCG SUBL One daily 30 tablet 3  . fenofibrate (TRICOR) 145 MG tablet Take  1 tablet  Daily  for Triglycerides (Blood Fats) 90 tablet 0  . levothyroxine (SYNTHROID) 75 MCG tablet Take  1 tablet  Daily  on an empty stomach with only water for 30 minutes & no Antacid meds, Calcium or Magnesium for 4 hours & avoid Biotin 90 tablet 0  . lisinopril (ZESTRIL) 40 MG tablet Take      1 tablet     Daily       for  BP & Diabetic Kidney Protection  90 tablet 1  . metFORMIN (GLUCOPHAGE-XR) 750 MG 24 hr tablet TAKE ONE TABLET BY MOUTH TWICE A DAY WITH MEALS 180 tablet 0  . NEEDLE, DISP, 21 G 21G X 1" MISC Use for testosterone injections, 3 ml 100 each 0  . NEEDLE, DISP, 25 G 25G X 1" MISC Use IM every 1-2 weeks for testosterone injection 100 each 0  . testosterone cypionate (DEPOTESTOSTERONE CYPIONATE) 200 MG/ML injection INJECT 2 MLS INTO THE MUSCLE EVERY 2 WEEKS 10 mL 0  . Vitamin D, Ergocalciferol, (DRISDOL) 1.25 MG (50000 UNIT) CAPS capsule TAKE 1 CAPSULE BY MOUTH EVERY WEEK 8 capsule 3  . phentermine (ADIPEX-P) 37.5 MG tablet Take     1 tablet      every Morning       for Dieting & Weight Loss (Patient not taking: Reported on 08/02/2020) 90 tablet 0  . VITAMIN E PO Take by mouth daily.    Marland Kitchen zinc gluconate 50 MG tablet Take 1 tablet (50 mg total) by mouth daily.     No current facility-administered medications on file prior to  visit.   Allergies:  No Known Allergies   Health Maintenance:  There is no immunization history for the selected administration types on file for this patient.  Tetanus: DUE but declines Pneumovax: Prevnar 13: Flu vaccine: DECLINES Shingrix: discuss at age 45 Covid 98: had infection unvaccinated, was mild, declines vaccine  Colonoscopy: due age 26 EGD:  Last eye:  Last dental:   Patient Care Team: Lucky Cowboy, MD as PCP - General (Internal Medicine)  Medical History:  has Hypertension; Hypothyroidism; Smoker; Morbid obesity (HCC); Vitamin D deficiency; Eczema; History of migraine; Hypercholesteremia; B12 deficiency; Medication management; Hypogonadism in male; Insulin resistance; and Erectile dysfunction on their problem list.   Surgical History:  He  has a past surgical history that includes No past surgeries. Family History:  His family history includes AAA (abdominal aortic aneurysm) in his maternal grandmother; Aneurysm (age of onset: 35) in his father; Cancer in his maternal grandfather and mother; Crohn's disease in his sister; Diabetes in his paternal aunt, paternal grandmother, and paternal uncle; Hyperlipidemia in his paternal grandmother; Hypertension in his father and sister; Stroke in his father, maternal grandmother, and paternal grandfather. Social History:   reports that he quit smoking about 7 years ago. His smoking use included cigarettes. He has a 20.00 pack-year smoking history. He has never used smokeless tobacco. He reports previous alcohol use. He reports previous drug use. Drugs: Marijuana, Amphetamines, Cocaine, and "Crack" cocaine.    Review of Systems:  Review of Systems  Constitutional: Positive for malaise/fatigue. Negative for chills, diaphoresis, fever and weight loss.  HENT: Negative.  Negative for hearing loss and tinnitus.   Eyes: Negative.  Negative for blurred vision and double vision.  Respiratory: Negative.  Negative for cough,  shortness of breath and wheezing.   Cardiovascular: Negative.  Negative for chest pain, palpitations, orthopnea, claudication and leg swelling.  Gastrointestinal: Negative.  Negative for abdominal pain, blood in stool, constipation, diarrhea, heartburn, melena, nausea and vomiting.  Genitourinary: Negative.  Negative for dysuria, flank pain, frequency, hematuria and urgency.       Difficulty with erection, left testicular mass  Musculoskeletal: Negative.  Negative for joint pain and myalgias.  Skin: Negative.  Negative for rash (chronic, scalp, ).  Neurological: Negative for dizziness, tingling, sensory change, weakness and headaches.  Endo/Heme/Allergies: Negative for polydipsia.  Psychiatric/Behavioral: Negative for depression, substance abuse and suicidal ideas. The patient is nervous/anxious. The  patient does not have insomnia.   All other systems reviewed and are negative.   Physical Exam: Estimated body mass index is 36 kg/m as calculated from the following:   Height as of this encounter: 5\' 9"  (1.753 m).   Weight as of this encounter: 243 lb 12.8 oz (110.6 kg). BP (!) 152/90   Pulse (!) 123   Temp (!) 97.5 F (36.4 C)   Ht 5\' 9"  (1.753 m)   Wt 243 lb 12.8 oz (110.6 kg)   SpO2 99%   BMI 36.00 kg/m  General Appearance: Well nourished, well dressed adult male in no apparent distress.  Eyes: PERRLA, EOMs, conjunctiva no swelling or erythema, normal fundi and vessels.  Sinuses: No Frontal/maxillary tenderness  ENT/Mouth: Ext aud canals clear, normal light reflex with TMs without erythema, bulging. Good dentition. No erythema, swelling, or exudate on post pharynx. Tonsils not swollen or erythematous. Hearing normal.  Neck: Supple, thyroid normal. No bruits  Respiratory: Respiratory effort normal, BS equal bilaterally without rales, rhonchi, wheezing or stridor.  Cardio: RRR without murmurs, rubs or gallops. Brisk peripheral pulses without edema.  Chest: symmetric, with normal  excursions and percussion.  Abdomen: Soft, obese abdomen, nontender, no guarding, rebound, hernias, masses, or organomegaly.  Lymphatics: Non tender without lymphadenopathy.  Musculoskeletal: Full ROM all peripheral extremities,5/5 strength, and normal gait.  Skin: Warm, dry without lesions, ecchymosis. He has flaky rash with erythematous base, some scaling, left post-auricular area, left jaw/neck Neuro: Cranial nerves intact, reflexes equal bilaterally. Normal muscle tone, no cerebellar symptoms. Sensation intact.  Psych: Awake and oriented X 3, normal affect, Insight and Judgment appropriate.  GU: Male genitalia: no penile lesions or discharge no bladder distension noted Penis: normal, no lesions, circumcised and frenulum piercing intact Urethral Meatus: normal Testicles: atrophic: right and mass 2-3 cmcm: left Scrotum: normal Epididymis: swelling: left   EKG: WNL no ST changes in 2020 - defer  Dan MakerAshley C Cayleigh Paull 5:37 PM [facility]

## 2020-08-02 ENCOUNTER — Encounter: Payer: Self-pay | Admitting: Adult Health

## 2020-08-02 ENCOUNTER — Ambulatory Visit (INDEPENDENT_AMBULATORY_CARE_PROVIDER_SITE_OTHER): Payer: Self-pay | Admitting: Adult Health

## 2020-08-02 ENCOUNTER — Other Ambulatory Visit: Payer: Self-pay

## 2020-08-02 VITALS — BP 152/90 | HR 123 | Temp 97.5°F | Ht 69.0 in | Wt 243.8 lb

## 2020-08-02 DIAGNOSIS — F17200 Nicotine dependence, unspecified, uncomplicated: Secondary | ICD-10-CM

## 2020-08-02 DIAGNOSIS — E063 Autoimmune thyroiditis: Secondary | ICD-10-CM

## 2020-08-02 DIAGNOSIS — E038 Other specified hypothyroidism: Secondary | ICD-10-CM

## 2020-08-02 DIAGNOSIS — D751 Secondary polycythemia: Secondary | ICD-10-CM

## 2020-08-02 DIAGNOSIS — E538 Deficiency of other specified B group vitamins: Secondary | ICD-10-CM

## 2020-08-02 DIAGNOSIS — Z79899 Other long term (current) drug therapy: Secondary | ICD-10-CM

## 2020-08-02 DIAGNOSIS — N5089 Other specified disorders of the male genital organs: Secondary | ICD-10-CM

## 2020-08-02 DIAGNOSIS — L309 Dermatitis, unspecified: Secondary | ICD-10-CM

## 2020-08-02 DIAGNOSIS — E7800 Pure hypercholesterolemia, unspecified: Secondary | ICD-10-CM

## 2020-08-02 DIAGNOSIS — E559 Vitamin D deficiency, unspecified: Secondary | ICD-10-CM

## 2020-08-02 DIAGNOSIS — Z0000 Encounter for general adult medical examination without abnormal findings: Secondary | ICD-10-CM

## 2020-08-02 DIAGNOSIS — E291 Testicular hypofunction: Secondary | ICD-10-CM

## 2020-08-02 DIAGNOSIS — Z8669 Personal history of other diseases of the nervous system and sense organs: Secondary | ICD-10-CM

## 2020-08-02 DIAGNOSIS — E88819 Insulin resistance, unspecified: Secondary | ICD-10-CM

## 2020-08-02 DIAGNOSIS — Z131 Encounter for screening for diabetes mellitus: Secondary | ICD-10-CM

## 2020-08-02 DIAGNOSIS — N529 Male erectile dysfunction, unspecified: Secondary | ICD-10-CM

## 2020-08-02 DIAGNOSIS — I10 Essential (primary) hypertension: Secondary | ICD-10-CM

## 2020-08-02 DIAGNOSIS — E8881 Metabolic syndrome: Secondary | ICD-10-CM

## 2020-08-02 MED ORDER — SILDENAFIL CITRATE 100 MG PO TABS: ORAL_TABLET | ORAL | 0 refills | Status: DC

## 2020-08-02 NOTE — Patient Instructions (Addendum)
YOU CAN CALL TO MAKE AN ULTRASOUND..  I have put in an order for an ultrasound for you to have You can set them up at your convenience by calling this number [phone] You will likely have the ultrasound at [address] [facility] [address]  If you have any issues call our office and we will set this up for you.      Know what a healthy weight is for you (roughly BMI <25) and aim to maintain this  Aim for 7+ servings of fruits and vegetables daily  65-80+ fluid ounces of water or unsweet tea for healthy kidneys  Limit to max 1 drink of alcohol per day; avoid smoking/tobacco  Limit animal fats in diet for cholesterol and heart health - choose grass fed whenever available  Avoid highly processed foods, and foods high in saturated/trans fats  Aim for low stress - take time to unwind and care for your mental health  Aim for 150 min of moderate intensity exercise weekly for heart health, and weights twice weekly for bone health  Aim for 7-9 hours of sleep daily   High-Fiber Eating Plan Fiber, also called dietary fiber, is a type of carbohydrate. It is found foods such as fruits, vegetables, whole grains, and beans. A high-fiber diet can have many health benefits. Your health care provider may recommend a high-fiber diet to help:  Prevent constipation. Fiber can make your bowel movements more regular.  Lower your cholesterol.  Relieve the following conditions: ? Inflammation of veins in the anus (hemorrhoids). ? Inflammation of specific areas of the digestive tract (uncomplicated diverticulosis). ? A problem of the large intestine, also called the colon, that sometimes causes pain and diarrhea (irritable bowel syndrome, or IBS).  Prevent overeating as part of a weight-loss plan.  Prevent heart disease, type 2 diabetes, and certain cancers. What are tips for following this plan? Reading food labels  Check the nutrition facts label on food products for the amount of dietary  fiber. Choose foods that have 5 grams of fiber or more per serving.  The goals for recommended daily fiber intake include: ? Men (age 84 or younger): 34-38 g. ? Men (over age 30): 28-34 g. ? Women (age 27 or younger): 25-28 g. ? Women (over age 59): 22-25 g. Your daily fiber goal is _____________ g.   Shopping  Choose whole fruits and vegetables instead of processed forms, such as apple juice or applesauce.  Choose a wide variety of high-fiber foods such as avocados, lentils, oats, and kidney beans.  Read the nutrition facts label of the foods you choose. Be aware of foods with added fiber. These foods often have high sugar and sodium amounts per serving. Cooking  Use whole-grain flour for baking and cooking.  Cook with [place]bke rice instead of white rice. Meal planning  Start the day with a breakfast that is high in fiber, such as a cereal that contains 5 g of fiber or more per serving.  Eat breads and cereals that are made with whole-grain flour instead of refined flour or white flour.  Eat Lampi rice, bulgur wheat, or millet instead of white rice.  Use beans in place of meat in soups, salads, and pasta dishes.  Be sure that half of the grains you eat each day are whole grains. General information  You can get the recommended daily intake of dietary fiber by: ? Eating a variety of fruits, vegetables, grains, nuts, and beans. ? Taking a fiber  supplement if you are not able to take in enough fiber in your diet. It is better to get fiber through food than from a supplement.  Gradually increase how much fiber you consume. If you increase your intake of dietary fiber too quickly, you may have bloating, cramping, or gas.  Drink plenty of water to help you digest fiber.  Choose high-fiber snacks, such as berries, raw vegetables, nuts, and popcorn. What foods should I eat? Fruits Berries. Pears. Apples. Oranges. Avocado. Prunes and raisins. Dried figs. Vegetables Sweet potatoes.  Spinach. Kale. Artichokes. Cabbage. Broccoli. Cauliflower. Green peas. Carrots. Squash. Grains Whole-grain breads. Multigrain cereal. Oats and oatmeal. [place] rice. Barley. Bulgur wheat. Millet. Quinoa. Bran muffins. Popcorn. Rye wafer crackers. Meats and other proteins Navy beans, kidney beans, and pinto beans. Soybeans. Split peas. Lentils. Nuts and seeds. Dairy Fiber-fortified yogurt. Beverages Fiber-fortified soy milk. Fiber-fortified orange juice. Other foods Fiber bars. The items listed above may not be a complete list of recommended foods and beverages. Contact a dietitian for more information. What foods should I avoid? Fruits Fruit juice. Cooked, strained fruit. Vegetables Fried potatoes. Canned vegetables. Well-cooked vegetables. Grains White bread. Pasta made with refined flour. White rice. Meats and other proteins Fatty cuts of meat. Fried chicken or fried fish. Dairy Milk. Yogurt. Cream cheese. Sour cream. Fats and oils Butters. Beverages Soft drinks. Other foods Cakes and pastries. The items listed above may not be a complete list of foods and beverages to avoid. Talk with your dietitian about what choices are best for you. Summary  Fiber is a type of carbohydrate. It is found in foods such as fruits, vegetables, whole grains, and beans.  A high-fiber diet has many benefits. It can help to prevent constipation, lower blood cholesterol, aid weight loss, and reduce your risk of heart disease, diabetes, and certain cancers.  Increase your intake of fiber gradually. Increasing fiber too quickly may cause cramping, bloating, and gas. Drink plenty of water while you increase the amount of fiber you consume.  The best sources of fiber include whole fruits and vegetables, whole grains, nuts, seeds, and beans. This information is not intended to replace advice given to you by your health care provider. Make sure you discuss any questions you have with your health care  provider. Document Revised: 07/01/2019 Document Reviewed: 07/01/2019 Elsevier Patient Education  2021 [employer].

## 2020-08-03 ENCOUNTER — Other Ambulatory Visit: Payer: Self-pay | Admitting: Adult Health

## 2020-08-03 DIAGNOSIS — R809 Proteinuria, unspecified: Secondary | ICD-10-CM

## 2020-08-03 LAB — COMPLETE METABOLIC PANEL WITH GFR
AG Ratio: 1.8 (calc) (ref 1.0–2.5)
ALT: 25 U/L (ref 9–46)
AST: 20 U/L (ref 10–40)
Albumin: 4.9 g/dL (ref 3.6–5.1)
Alkaline phosphatase (APISO): 40 U/L (ref 36–130)
BUN/Creatinine Ratio: 11 (calc) (ref 6–22)
BUN: 17 mg/dL (ref 7–25)
CO2: 28 mmol/L (ref 20–32)
Calcium: 10.3 mg/dL (ref 8.6–10.3)
Chloride: 101 mmol/L (ref 98–110)
Creat: 1.5 mg/dL — ABNORMAL HIGH (ref 0.60–1.35)
GFR, Est African American: 66 mL/min/{1.73_m2} (ref >=60)
GFR, Est Non African American: 57 mL/min/{1.73_m2} — ABNORMAL LOW (ref >=60)
Globulin: 2.8 g/dL (calc) (ref 1.9–3.7)
Glucose, Bld: 87 mg/dL (ref 65–99)
Potassium: 4.4 mmol/L (ref 3.5–5.3)
Sodium: 140 mmol/L (ref 135–146)
Total Bilirubin: 0.4 mg/dL (ref 0.2–1.2)
Total Protein: 7.7 g/dL (ref 6.1–8.1)

## 2020-08-03 LAB — URINALYSIS, ROUTINE W REFLEX MICROSCOPIC
Bacteria, UA: NONE SEEN /HPF
Bilirubin Urine: NEGATIVE
Glucose, UA: NEGATIVE
Hgb urine dipstick: NEGATIVE
Hyaline Cast: NONE SEEN /LPF
Leukocytes,Ua: NEGATIVE
Nitrite: NEGATIVE
RBC / HPF: NONE SEEN /HPF (ref 0–2)
Specific Gravity, Urine: 1.028 (ref 1.001–1.035)
Squamous Epithelial / HPF: NONE SEEN /HPF (ref <=5)
pH: 6 (ref 5.0–8.0)

## 2020-08-03 LAB — TSH: TSH: 2.68 mIU/L (ref 0.40–4.50)

## 2020-09-01 ENCOUNTER — Ambulatory Visit
Admission: RE | Admit: 2020-09-01 | Discharge: 2020-09-01 | Disposition: A | Discharge status: Home / Self Care | Payer: Self-pay | Source: Ambulatory Visit | Attending: Adult Health | Admitting: Adult Health

## 2020-09-01 DIAGNOSIS — N5089 Other specified disorders of the male genital organs: Secondary | ICD-10-CM

## 2020-09-04 ENCOUNTER — Other Ambulatory Visit: Payer: Self-pay | Admitting: Adult Health

## 2020-09-04 DIAGNOSIS — I861 Scrotal varices: Secondary | ICD-10-CM

## 2020-09-15 ENCOUNTER — Other Ambulatory Visit: Payer: Self-pay | Admitting: Adult Health

## 2020-09-27 ENCOUNTER — Ambulatory Visit (INDEPENDENT_AMBULATORY_CARE_PROVIDER_SITE_OTHER): Payer: Self-pay | Admitting: Urology

## 2020-09-27 ENCOUNTER — Other Ambulatory Visit: Payer: Self-pay

## 2020-09-27 VITALS — BP 148/98 | HR 112 | Ht 69.0 in | Wt 250.0 lb

## 2020-09-27 DIAGNOSIS — N503 Cyst of epididymis: Secondary | ICD-10-CM

## 2020-09-27 DIAGNOSIS — I861 Scrotal varices: Secondary | ICD-10-CM

## 2020-09-27 DIAGNOSIS — N5082 Scrotal pain: Secondary | ICD-10-CM

## 2020-09-27 NOTE — Progress Notes (Signed)
09/27/2020 9:55 AM   Scott Sutton December 20, 1980 [id number]  Referring provider: Lucky Cowboy, MD [address] [place],  [place] [address]  Chief Complaint  Patient presents with   varicocele    HPI: 40 year old male who presents today for further evaluation of left sided scrotal pain.  He reports that about 7 or 8 months ago, he started spearing seeing dull achy intermittent left testicular and scrotal pain.  Sometimes radiated to the groin.  No obvious exacerbating or alleviating symptoms although he does think hurts worse with prolonged standing.  The pain waxes and wanes it is not continuous.  Difficult to predict when it will occur.  He is been wearing some tighter underwear i.e. jockstrap which is helped somewhat and is taking NSAIDs in the morning.  He denies any urinary symptoms, genital swelling or discharge.  He did have a comprehensive STI check late last year which was negative.  He declined repeat testing today, reports that he is not really at risk or concerned about STIs currently.  He underwent scrotal ultrasound which showed a left varicocele, left epididymal cyst as well as a nonspecific vague hypoechoic area measuring approximately 1 cm adjacent to the varicocele of unclear origin, possibly representing herniated fat.  He also reports that he feels like his testicles are shrinking.  He is on exogenous testosterone.  He is currently uninsured and is working to get benefits but is only part-time employee right now.  This impacts his decision-making.   PMH: Past Medical History:  Diagnosis Date   Hyperlipidemia    Hypertension    Hypothyroidism    Testosterone deficiency     Surgical History: Past Surgical History:  Procedure Laterality Date   NO PAST SURGERIES      Home Medications:  Allergies as of 09/27/2020   No Known Allergies      Medication List        Accurate as of September 27, 2020  9:55 AM. If you have any questions, ask  your nurse or doctor.          B-12 500 MCG Subl One daily   clonazePAM 1 MG tablet Commonly known as: KlonoPIN Take 1 tablet (1 mg total) by mouth 2 (two) times daily as needed for anxiety.   fenofibrate 145 MG tablet Commonly known as: TRICOR Take  1 tablet  Daily  for Triglycerides (Blood Fats)   levothyroxine 75 MCG tablet Commonly known as: SYNTHROID Take  1 tablet  Daily  on an empty stomach with only water for 30 minutes & no Antacid meds, Calcium or Magnesium for 4 hours & avoid Biotin   lisinopril 40 MG tablet Commonly known as: ZESTRIL Take      1 tablet     Daily       for  BP & Diabetic Kidney Protection   metFORMIN 750 MG 24 hr tablet Commonly known as: GLUCOPHAGE-XR TAKE ONE TABLET BY MOUTH TWICE A DAY WITH MEALS   NEEDLE (DISP) 21 G 21G X 1" Misc Use for testosterone injections, 3 ml   NEEDLE (DISP) 25 G 25G X 1" Misc Use IM every 1-2 weeks for testosterone injection   phentermine 37.5 MG tablet Commonly known as: ADIPEX-P Take     1 tablet      every Morning       for Dieting & Weight Loss   sildenafil 100 MG tablet Commonly known as: VIAGRA TAKE 1/2 TO 1 (ONE-HALF TO ONE) TABLET BY MOUTH 1  HOUR PRIOR TO INTERCOURSE AS NEEDED   testosterone cypionate 200 MG/ML injection Commonly known as: DEPOTESTOSTERONE CYPIONATE INJECT 2 MLS INTO THE MUSCLE EVERY 2 WEEKS   VITAMIN C PO Take by mouth daily.   Vitamin D (Ergocalciferol) 1.25 MG (50000 UNIT) Caps capsule Commonly known as: DRISDOL TAKE 1 CAPSULE BY MOUTH EVERY WEEK   VITAMIN E PO Take by mouth daily.   zinc gluconate 50 MG tablet Take 1 tablet (50 mg total) by mouth daily.        Allergies: No Known Allergies  Family History: Family History  Problem Relation Age of Onset   Cancer Mother        endometrial cancer   Hypertension Father    Stroke Father    Aneurysm Father 77   Diabetes Paternal Aunt    Diabetes Paternal Uncle    Stroke Maternal Grandmother    AAA (abdominal  aortic aneurysm) Maternal Grandmother    Cancer Maternal Grandfather        unknown type   Stroke Paternal Grandfather    Crohn's disease Sister    Hypertension Sister    Diabetes Paternal Grandmother    Hyperlipidemia Paternal Grandmother     Social History:  reports that he quit smoking about 7 years ago. His smoking use included cigarettes. He has a 20.00 pack-year smoking history. He has never used smokeless tobacco. He reports previous alcohol use. He reports previous drug use. Drugs: Marijuana, Amphetamines, Cocaine, and "Crack" cocaine.   Physical Exam: BP (!) 148/98   Pulse (!) 112   Ht 5\' 9"  (1.753 m)   Wt 250 lb (113.4 kg)   BMI 36.92 kg/m   Constitutional:  Alert and oriented, No acute distress. HEENT: [place] AT, moist mucus membranes.  Trachea midline, no masses. Cardiovascular: No clubbing, cyanosis, or edema. Respiratory: Normal respiratory effort, no increased work of breathing. GU: Normal bilateral descended testicles.  Fullness/firmness of the right epididymis measuring approximately 1 cm which is consistent with probable epididymal cyst.  Full left epididymal cord without the ability to palpate discrete structures.  No obvious left inguinal ring defect with Valsalva although exam somewhat limited by habitus. Skin: No rashes, bruises or suspicious lesions. Neurologic: Grossly intact, no focal deficits, moving all 4 extremities. Psychiatric: Normal mood and affect.  Laboratory Data: Lab Results  Component Value Date   WBC 6.9 04/19/2020   HGB 18.1 (H) 04/19/2020   HCT 52.9 (H) 04/19/2020   MCV 88.2 04/19/2020   PLT 273 04/19/2020    Lab Results  Component Value Date   CREATININE 1.50 (H) 08/02/2020    Lab Results  Component Value Date   PSA 2.0 07/22/2019    Lab Results  Component Value Date   TESTOSTERONE 556 09/01/2019    Lab Results  Component Value Date   HGBA1C 5.4 04/13/2018    Urinalysis    Component Value Date/Time   COLORURINE YELLOW  08/02/2020 1618   APPEARANCEUR CLEAR 08/02/2020 1618   LABSPEC 1.028 08/02/2020 1618   PHURINE 6.0 08/02/2020 1618   GLUCOSEU NEGATIVE 08/02/2020 1618   HGBUR NEGATIVE 08/02/2020 1618   KETONESUR TRACE (A) 08/02/2020 1618   PROTEINUR 1+ (A) 08/02/2020 1618   NITRITE NEGATIVE 08/02/2020 1618   LEUKOCYTESUR NEGATIVE 08/02/2020 1618    Lab Results  Component Value Date   BACTERIA NONE SEEN 08/02/2020    Pertinent Imaging: IMPRESSION: 1. Large left-sided varicocele, within associated ill-defined hypoechoic region within the area of the varicocele and separate from the normal appearing  left testicle. The appearance is entirely nonspecific, and correlation with physical exam findings are recommended. Left-sided scrotal hernia could give this appearance. 2. Suboptimal visualization of the left epididymis proper, with a small left epididymal cyst as above. 3. Otherwise unremarkable exam.     Electronically Signed   By: Sharlet Salina M.D.   On: 09/02/2020 10:32  Scrotal ultrasound images were personally reviewed today.  Agree with radiologic interpretation.   Assessment & Plan:    1. Varicocele We discussed the pathophysiology of varicocele, primarily left-sided and often incidental findings.  These often do not cause pain or discomfort especially if they are more subtle as was notable on exam today.  We did discuss implications on fertility.  He is not worried about this.  As such, we will likely manage conservatively. - CT Pelvis Wo Contrast; Future - Urinalysis, Complete  2. Epididymal cyst Incidental left epididymal cyst appreciated both on ultrasound and exam today, likely not cause of his pain although "Concerto surgical excision if he continues to have intermittent symptoms.  He prefers a more conservative approach with NSAIDs as needed and scrotal support.  He was reassured today.  3. Scrotal pain Etiology remains somewhat unclear, suspect the above 2 findings are  likely incidental  Declined STI testing  There is a somewhat amorphous 1 cm area along the cord which may represent possibly a lipoma of the cord versus a small fat containing hernia.  Exam today was somewhat difficult although there was no obvious hernia.  Have offered him a noncontrast CT of the pelvis for more definitive diagnostic evaluation.  Given that he is self-pay, he is not sure if he wants to pursue this or not.  When I place order today and he will investigate pricing for this.  Even if it is small hernia, he may not want him intervene in this I will leave up to him whether or not he like to pursue this.  He was advised to return if his symptoms worsen.  I will call him with his CT results if he does not elect to pursue this.   Vanna Scotland, MD  [facility] [address] [place], [place] [address] [phone]

## 2020-09-28 LAB — URINALYSIS, COMPLETE
Bilirubin, UA: NEGATIVE
Glucose, UA: NEGATIVE
Ketones, UA: NEGATIVE
Leukocytes,UA: NEGATIVE
Nitrite, UA: NEGATIVE
Protein,UA: NEGATIVE
RBC, UA: NEGATIVE
Specific Gravity, UA: 1.02 (ref 1.005–1.030)
Urobilinogen, Ur: 0.2 mg/dL (ref 0.2–1.0)
pH, UA: 6.5 (ref 5.0–7.5)

## 2020-09-28 LAB — MICROSCOPIC EXAMINATION: Bacteria, UA: NONE SEEN

## 2020-10-05 ENCOUNTER — Encounter: Payer: Self-pay | Admitting: Urology

## 2020-10-09 ENCOUNTER — Encounter: Payer: Self-pay | Admitting: Urology

## 2020-10-09 ENCOUNTER — Encounter: Payer: Self-pay | Admitting: *Deleted

## 2020-10-11 ENCOUNTER — Other Ambulatory Visit: Payer: Self-pay | Admitting: Adult Health

## 2020-10-16 ENCOUNTER — Telehealth: Payer: Self-pay | Admitting: *Deleted

## 2020-10-16 ENCOUNTER — Encounter: Payer: Self-pay | Admitting: *Deleted

## 2020-10-16 NOTE — Telephone Encounter (Signed)
See Mychart.

## 2020-10-16 NOTE — Telephone Encounter (Signed)
Work note updated with correct dates-sent through [employer]. Patient aware.

## 2020-10-23 ENCOUNTER — Ambulatory Visit (HOSPITAL_COMMUNITY)
Admission: RE | Admit: 2020-10-23 | Discharge: 2020-10-23 | Disposition: A | Discharge status: Home / Self Care | Payer: Self-pay | Source: Ambulatory Visit | Attending: Urology | Admitting: Urology

## 2020-10-23 ENCOUNTER — Other Ambulatory Visit: Payer: Self-pay

## 2020-10-23 ENCOUNTER — Encounter (HOSPITAL_COMMUNITY): Payer: Self-pay

## 2020-10-23 DIAGNOSIS — I861 Scrotal varices: Secondary | ICD-10-CM | POA: Insufficient documentation

## 2020-11-08 ENCOUNTER — Other Ambulatory Visit: Payer: Self-pay

## 2020-11-08 DIAGNOSIS — F419 Anxiety disorder, unspecified: Secondary | ICD-10-CM

## 2020-11-08 DIAGNOSIS — E038 Other specified hypothyroidism: Secondary | ICD-10-CM

## 2020-11-08 DIAGNOSIS — E7800 Pure hypercholesterolemia, unspecified: Secondary | ICD-10-CM

## 2020-11-08 MED ORDER — FENOFIBRATE 145 MG PO TABS: ORAL_TABLET | ORAL | 1 refills | Status: DC

## 2020-11-08 MED ORDER — METFORMIN HCL ER 750 MG PO TB24: 750.0000 mg | ORAL_TABLET | Freq: Two times a day (BID) | ORAL | 1 refills | Status: DC

## 2020-11-08 MED ORDER — LEVOTHYROXINE SODIUM 75 MCG PO TABS: ORAL_TABLET | ORAL | 1 refills | Status: DC

## 2020-11-08 MED ORDER — SILDENAFIL CITRATE 100 MG PO TABS: ORAL_TABLET | ORAL | 0 refills | Status: DC

## 2020-11-08 MED ORDER — CLONAZEPAM 1 MG PO TABS: 1.0000 mg | ORAL_TABLET | Freq: Two times a day (BID) | ORAL | 0 refills | Status: DC | PRN

## 2020-11-24 ENCOUNTER — Encounter (HOSPITAL_COMMUNITY): Payer: Self-pay | Admitting: Radiology

## 2020-12-22 ENCOUNTER — Other Ambulatory Visit: Payer: Self-pay | Admitting: Adult Health

## 2020-12-22 DIAGNOSIS — I10 Essential (primary) hypertension: Secondary | ICD-10-CM

## 2020-12-22 MED ORDER — LISINOPRIL 40 MG PO TABS: ORAL_TABLET | ORAL | 1 refills | Status: DC

## 2020-12-22 MED ORDER — TESTOSTERONE CYPIONATE 200 MG/ML IM SOLN: INTRAMUSCULAR | 0 refills | Status: DC

## 2021-02-09 DIAGNOSIS — Z720 Tobacco use: Secondary | ICD-10-CM | POA: Insufficient documentation

## 2021-02-09 DIAGNOSIS — K5730 Diverticulosis of large intestine without perforation or abscess without bleeding: Secondary | ICD-10-CM | POA: Insufficient documentation

## 2021-02-09 NOTE — Progress Notes (Signed)
6 MONTH FOLLOW UP  Assessment and Plan:   Essential hypertension - above goal today, reports well controlled at home  - continue lisinopril - DASH diet, exercise and monitor at home. Call if greater than 130/80.  -     COMPLETE METABOLIC PANEL WITH GFR  Hypercholesteremia Has improved lifestyle decrease fatty foods increase activity.  -     Lipid panel   Hypothyroidism due to Hashimoto's thyroiditis Hypothyroidism-check TSH level, continue medications the same, reminded to take on an empty stomach 30-72mins before food.  -     TSH  Other abnormal glucose/insulin resistance  Discussed disease and risks Discussed diet/exercise, weight management        -     A1C - declines - checking CMP for glucose  Morbid obesity (HCC) - BMI 36 with hyperlipidemia, hypothyroid - long discussion about lifestyle, metabolic disease - plant based, minimally processed, low saturated fat, high fiber, mediterranean type diet and benefits reviewed - follow up 6 months for progress monitoring  B12 deficiency -     Vitamin B12 - defer   Medication management -     Magnesium- defer  Vitamin D deficiency Continue supplement  Former smoker/current vaper Smoking cessation-  instruction/counseling given, counseled patient on the dangers of tobacco use, advised patient to stop smoking, and reviewed strategies to maximize success  History of migraine Monitor  Eczema, unspecified type .montior - elimination diet Topical steroid PRN-   Polycythemia  Timing coincides with onset of testosterone; typical SE; monitor and decrease dose as needed - check ferritin if doesn't improve with dose adjustment  Testosterone def Continued perceived benefits with supplementation, no SE. Check level PRN, continue zinc, no symptoms of elevated estrogen or dihydrotestosterone.   Erectile dysfunction Cialis worked better per paient, will do with next refill Weight loss encouraged; discussed vascular  mechanism  Left testicular mass/tenderness - US scrotum showed varicocele and cyst - has seen urology, CT showed no hernia or other concerning findings - patient is reassured, monitoring  Patient is uninsured and cash pay; shared decision making for lab.   Discussed med's effects and SE's. Labs and tests as requested with regular follow-up as recommended. Over 30 minutes of exam, counseling, chart review and critical decision making was performed  Future Appointments  Date Time Provider [place]  08/02/2021  3:00 PM Liane Comber, NP [facility] None    HPI 40 y.o. male patient presents for 6 month follow up. He has Hypertension; Hypothyroidism; Smoker; Morbid obesity ([place]); Vitamin D deficiency; Eczema; History of migraine; Hypercholesteremia; B12 deficiency; Medication management; Hypogonadism in male; Insulin resistance; Erectile dysfunction; Mass of left testicle; Current every day nicotine vapor product user; and Sigmoid diverticulosis on their problem list.   He reported ED sx at last visit, despite testosterone supplement. Admittedly poor lifestyle, but good relationship prior to onset. He was prescribed sildenafil and reports 50 mg typically works, but preferred tadalafil.   He had tender left testicular mass, had [place] that showed varicocele and cyst; has seen uro Dr. 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 He was reassured and monitoring.   He has history of depression/anxiety- has been on several agents in the past, prozac, depakote, wellbutrin, xanax, zoloft. States he had been on 40 mg valium in a day. At one point he was on 5 meds for depression/anxiety at one time.  The SSRI's he was having mood swings. He now  uses lifestyle management/stress techniques and feels doing better. Recently has klonopin 1 mg BID PRN, last refill 30 tabs filled  11/08/2020, still has some left.   Smoker, has been vaping for many years, has reduced down to stick pod, gradually reducing, got to try to quit this year.   BMI is Body mass index is 36.03 kg/m., he has been working on diet and but does try to make better choices, tries to limit starches, cut sugars,eats more fresh fruit/veggies, choose whole grain, etc.  He has been prescribed phentermine intermittently.  Wt Readings from Last 3 Encounters:  02/13/21 244 lb (110.7 kg)  09/27/20 250 lb (113.4 kg)  08/02/20 243 lb 12.8 oz (110.6 kg)   His blood pressure has been controlled at home (unsure of numbers but reports machine states "in good range"), reports taking lisinopril 40 mg regularly, has had BP x teens, today their BP is BP: (!) 132/98.  He does not workout. He denies chest pain, shortness of breath, dizziness.   He has been working on diet/exercise for cholesterol, severe trig elevations improved but not controlled on fenofibrate.  Lab Results  Component Value Date   CHOL 184 11/01/2019   HDL 30 (L) 11/01/2019   LDLCALC 110 (H) 11/01/2019   TRIG 331 (H) 11/01/2019   CHOLHDL 6.1 (H) 11/01/2019   He has insulin resistance, has been on metformin 750 mg 1 tab daily. Last A1C in the office was:  Lab Results  Component Value Date   HGBA1C 5.4 04/13/2018   He is on thyroid medication. His medication was not changed last visit. He takes levothyroxine 75 mcg daily.   Lab Results  Component Value Date   TSH 2.68 08/02/2020   Patient is on Vitamin D supplement, taking 50000 IU weekly Lab Results  Component Value Date   VD25OH 14 11/01/2019     He has a history of testosterone deficiency and is on testosterone replacement, he has been taking 400 mg q2w, last injection 02/11/2021. He states that the testosterone helps with his energy, libido, muscle mass, but denies benefit with ED.  Lab Results  Component Value Date   TESTOSTERONE 556 09/01/2019       Current Medications:   Current Outpatient Medications on File Prior to Visit  Medication Sig Dispense Refill   Ascorbic Acid (VITAMIN C PO) Take by mouth daily.     clonazePAM (KLONOPIN) 1 MG tablet Take 1 tablet (1 mg total) by mouth 2 (two) times daily as needed for anxiety. 30 tablet 0   Cyanocobalamin (B-12) 500 MCG SUBL One daily 30 tablet 3   fenofibrate (TRICOR) 145 MG tablet Take  1 tablet  Daily  for Triglycerides (Blood Fats) 90 tablet 1   levothyroxine (SYNTHROID) 75 MCG tablet Take  1 tablet  Daily  on an empty stomach with only water for 30 minutes & no Antacid meds, Calcium or Magnesium for 4 hours & avoid Biotin 90 tablet 1   lisinopril (ZESTRIL) 40 MG tablet Take 1 tablet Daily for BP & Diabetic Kidney Protection 90 tablet 1   metFORMIN (GLUCOPHAGE-XR) 750 MG 24 hr tablet Take 1 tablet (750 mg total) by mouth 2 (two) times daily with a meal. 180 tablet 1   sildenafil (VIAGRA) 100 MG tablet TAKE 1/2 TO 1 (ONE-HALF TO ONE) TABLET BY MOUTH 1 HOUR PRIOR TO INTERCOURSE AS NEEDED 30 tablet 0   testosterone cypionate (DEPOTESTOSTERONE CYPIONATE) 200 MG/ML injection inject 34mls into the muscle every 2 weeks 10 mL 0  Vitamin D, Ergocalciferol, (DRISDOL) 1.25 MG (50000 UNIT) CAPS capsule TAKE 1 CAPSULE BY MOUTH EVERY WEEK 8 capsule 3   VITAMIN E PO Take by mouth daily.     zinc gluconate 50 MG tablet Take 1 tablet (50 mg total) by mouth daily.     NEEDLE, DISP, 21 G 21G X 1" MISC Use for testosterone injections, 3 ml 100 each 0   NEEDLE, DISP, 25 G 25G X 1" MISC Use IM every 1-2 weeks for testosterone injection 100 each 0   phentermine (ADIPEX-P) 37.5 MG tablet Take     1 tablet      every Morning       for Dieting & Weight Loss (Patient not taking: Reported on 02/13/2021) 90 tablet 0   No current facility-administered medications on file prior to visit.   Allergies:  No Known Allergies   Medical History:  has Hypertension; Hypothyroidism; Smoker; Morbid obesity (HCC); Vitamin D deficiency; Eczema; History  of migraine; Hypercholesteremia; B12 deficiency; Medication management; Hypogonadism in male; Insulin resistance; Erectile dysfunction; Mass of left testicle; Current every day nicotine vapor product user; and Sigmoid diverticulosis on their problem list.   Surgical History:  He  has a past surgical history that includes No past surgeries. Family History:  His family history includes AAA (abdominal aortic aneurysm) in his maternal grandmother; Aneurysm (age of onset: 69) in his father; Cancer in his maternal grandfather and mother; Crohn's disease in his sister; Diabetes in his paternal aunt, paternal grandmother, and paternal uncle; Hyperlipidemia in his paternal grandmother; Hypertension in his father and sister; Stroke in his father, maternal grandmother, and paternal grandfather. Social History:   reports that he quit smoking about 7 years ago. His smoking use included cigarettes. He has a 20.00 pack-year smoking history. He has never used smokeless tobacco. He reports that he does not currently use alcohol. He reports that he does not currently use drugs after having used the following drugs: Marijuana, Amphetamines, Cocaine, and "Crack" cocaine.    Review of Systems:  Review of Systems  Constitutional:  Positive for malaise/fatigue. Negative for chills, diaphoresis, fever and weight loss.  HENT: Negative.  Negative for hearing loss and tinnitus.   Eyes: Negative.  Negative for blurred vision and double vision.  Respiratory: Negative.  Negative for cough, shortness of breath and wheezing.   Cardiovascular: Negative.  Negative for chest pain, palpitations, orthopnea, claudication and leg swelling.  Gastrointestinal: Negative.  Negative for abdominal pain, blood in stool, constipation, diarrhea, heartburn, melena, nausea and vomiting.  Genitourinary: Negative.  Negative for dysuria, flank pain, frequency, hematuria and urgency.       Difficulty with erection, left testicular mass   Musculoskeletal: Negative.  Negative for joint pain and myalgias.  Skin: Negative.  Negative for rash (chronic, scalp, ).  Neurological:  Negative for dizziness, tingling, sensory change, weakness and headaches.  Endo/Heme/Allergies:  Negative for polydipsia.  Psychiatric/Behavioral:  Negative for depression, substance abuse and suicidal ideas. The patient is nervous/anxious. The patient does not have insomnia.   All other systems reviewed and are negative.  Physical Exam: Estimated body mass index is 36.03 kg/m as calculated from the following:   Height as of 09/27/20: 5\' 9"  (1.753 m).   Weight as of this encounter: 244 lb (110.7 kg). BP (!) 132/98   Pulse 93   Temp (!) 97.5 F (36.4 C)   Wt 244 lb (110.7 kg)   SpO2 99%   BMI 36.03 kg/m  General Appearance: Well nourished, well dressed  adult male in no apparent distress.  Eyes: PERRLA, EOMs, conjunctiva no swelling or erythema, normal fundi and vessels.  Sinuses: No Frontal/maxillary tenderness  ENT/Mouth: Ext aud canals clear, normal light reflex with TMs without erythema, bulging. Good dentition. No erythema, swelling, or exudate on post pharynx. Tonsils not swollen or erythematous. Hearing normal.  Neck: Supple, thyroid normal. No bruits  Respiratory: Respiratory effort normal, BS equal bilaterally without rales, rhonchi, wheezing or stridor.  Cardio: RRR without murmurs, rubs or gallops. Brisk peripheral pulses without edema.  Chest: symmetric, with normal excursions and percussion.  Abdomen: Soft, obese abdomen, nontender, no guarding, rebound, hernias, masses, or organomegaly.  Lymphatics: Non tender without lymphadenopathy.  Musculoskeletal: Full ROM all peripheral extremities,5/5 strength, and normal gait.  Skin: Warm, dry without lesions, ecchymosis. He has flaky rash with erythematous base, some scaling, left post-auricular area, left jaw/neck Neuro: Cranial nerves intact, reflexes equal bilaterally. Normal muscle tone, no  cerebellar symptoms. Sensation intact.  Psych: Awake and oriented X 3, intermittently tearful affect, Insight and Judgment appropriate.     Scott Sutton Scott Sutton 11:05 AM [facility]

## 2021-02-13 ENCOUNTER — Encounter: Payer: Self-pay | Admitting: Adult Health

## 2021-02-13 ENCOUNTER — Other Ambulatory Visit: Payer: Self-pay

## 2021-02-13 ENCOUNTER — Ambulatory Visit (INDEPENDENT_AMBULATORY_CARE_PROVIDER_SITE_OTHER): Payer: Self-pay | Admitting: Adult Health

## 2021-02-13 VITALS — BP 132/88 | HR 93 | Temp 97.5°F | Wt 244.0 lb

## 2021-02-13 DIAGNOSIS — E8881 Metabolic syndrome: Secondary | ICD-10-CM

## 2021-02-13 DIAGNOSIS — E063 Autoimmune thyroiditis: Secondary | ICD-10-CM

## 2021-02-13 DIAGNOSIS — E038 Other specified hypothyroidism: Secondary | ICD-10-CM

## 2021-02-13 DIAGNOSIS — E538 Deficiency of other specified B group vitamins: Secondary | ICD-10-CM

## 2021-02-13 DIAGNOSIS — Z720 Tobacco use: Secondary | ICD-10-CM

## 2021-02-13 DIAGNOSIS — Z79899 Other long term (current) drug therapy: Secondary | ICD-10-CM

## 2021-02-13 DIAGNOSIS — I10 Essential (primary) hypertension: Secondary | ICD-10-CM

## 2021-02-13 DIAGNOSIS — E559 Vitamin D deficiency, unspecified: Secondary | ICD-10-CM

## 2021-02-13 DIAGNOSIS — K5730 Diverticulosis of large intestine without perforation or abscess without bleeding: Secondary | ICD-10-CM

## 2021-02-13 DIAGNOSIS — E291 Testicular hypofunction: Secondary | ICD-10-CM

## 2021-02-13 DIAGNOSIS — E7800 Pure hypercholesterolemia, unspecified: Secondary | ICD-10-CM

## 2021-02-13 LAB — COMPLETE METABOLIC PANEL WITH GFR
AG Ratio: 1.6 (calc) (ref 1.0–2.5)
ALT: 33 U/L (ref 9–46)
AST: 27 U/L (ref 10–40)
Albumin: 4.7 g/dL (ref 3.6–5.1)
Alkaline phosphatase (APISO): 41 U/L (ref 36–130)
BUN: 19 mg/dL (ref 7–25)
CO2: 31 mmol/L (ref 20–32)
Calcium: 10.2 mg/dL (ref 8.6–10.3)
Chloride: 101 mmol/L (ref 98–110)
Creat: 1.29 mg/dL (ref 0.60–1.29)
Globulin: 3 g/dL (calc) (ref 1.9–3.7)
Glucose, Bld: 91 mg/dL (ref 65–99)
Potassium: 4.4 mmol/L (ref 3.5–5.3)
Sodium: 140 mmol/L (ref 135–146)
Total Bilirubin: 0.6 mg/dL (ref 0.2–1.2)
Total Protein: 7.7 g/dL (ref 6.1–8.1)
eGFR: 71 mL/min/{1.73_m2} (ref >=60)

## 2021-02-13 LAB — LIPID PANEL
Cholesterol: 143 mg/dL (ref <200)
HDL: 34 mg/dL — ABNORMAL LOW (ref >=40)
LDL Cholesterol (Calc): 86 mg/dL (calc)
Non-HDL Cholesterol (Calc): 109 mg/dL (calc) (ref <130)
Total CHOL/HDL Ratio: 4.2 (calc) (ref <5.0)
Triglycerides: 133 mg/dL (ref <150)

## 2021-02-13 LAB — TSH: TSH: 4.23 mIU/L (ref 0.40–4.50)

## 2021-03-06 ENCOUNTER — Other Ambulatory Visit: Payer: Self-pay | Admitting: Adult Health

## 2021-03-06 ENCOUNTER — Telehealth: Payer: Self-pay

## 2021-03-06 MED ORDER — TESTOSTERONE CYPIONATE 200 MG/ML IM SOLN: INTRAMUSCULAR | 0 refills | Status: DC

## 2021-03-06 NOTE — Progress Notes (Signed)
Future Appointments  Date Time Provider Department Center  08/02/2021  3:00 PM Judd Gaudier, NP [facility] None    PDMP reviewed for testosterone refill request.

## 2021-03-06 NOTE — Telephone Encounter (Signed)
Refill request for Testosterone °

## 2021-03-07 ENCOUNTER — Encounter: Payer: Self-pay | Admitting: Adult Health

## 2021-03-07 ENCOUNTER — Other Ambulatory Visit: Payer: Self-pay | Admitting: Adult Health

## 2021-03-07 DIAGNOSIS — I10 Essential (primary) hypertension: Secondary | ICD-10-CM

## 2021-03-07 DIAGNOSIS — E7800 Pure hypercholesterolemia, unspecified: Secondary | ICD-10-CM

## 2021-03-07 DIAGNOSIS — E063 Autoimmune thyroiditis: Secondary | ICD-10-CM

## 2021-03-07 MED ORDER — TESTOSTERONE CYPIONATE 200 MG/ML IM SOLN: INTRAMUSCULAR | 0 refills | Status: DC

## 2021-03-07 MED ORDER — SILDENAFIL CITRATE 100 MG PO TABS: ORAL_TABLET | ORAL | 0 refills | Status: DC

## 2021-03-07 MED ORDER — METFORMIN HCL ER 750 MG PO TB24: 750.0000 mg | ORAL_TABLET | Freq: Two times a day (BID) | ORAL | 1 refills | Status: DC

## 2021-03-07 MED ORDER — LISINOPRIL 40 MG PO TABS: ORAL_TABLET | ORAL | 1 refills | Status: DC

## 2021-03-07 MED ORDER — FENOFIBRATE 145 MG PO TABS: ORAL_TABLET | ORAL | 1 refills | Status: DC

## 2021-03-07 MED ORDER — LEVOTHYROXINE SODIUM 75 MCG PO TABS: ORAL_TABLET | ORAL | 1 refills | Status: DC

## 2021-04-16 ENCOUNTER — Encounter: Payer: Self-pay | Admitting: Adult Health

## 2021-05-30 ENCOUNTER — Other Ambulatory Visit: Payer: Self-pay | Admitting: Adult Health

## 2021-05-31 MED ORDER — TESTOSTERONE CYPIONATE 200 MG/ML IM SOLN: INTRAMUSCULAR | 0 refills | Status: AC

## 2021-05-31 MED ORDER — SILDENAFIL CITRATE 100 MG PO TABS: ORAL_TABLET | ORAL | 0 refills | Status: AC

## 2021-08-02 ENCOUNTER — Encounter: Payer: Self-pay | Admitting: Adult Health

## 2021-08-02 NOTE — Progress Notes (Unsigned)
CPE  Assessment and Plan:  Encounter for Annual Physical Exam with abnormal findings Due annually  Health Maintenance reviewed Healthy lifestyle reviewed and goals set  Essential hypertension - above goal today, reports well controlled at home *** - continue lisinopril - DASH diet, exercise and monitor at home. Call if greater than 130/80.  -     COMPLETE METABOLIC PANEL WITH GFR -     Urinalysis, Routine w reflex microscopic  Hypercholesteremia Admitted non-compliant with diet - declines check today Wants to work on lifestyle - check next visit  decrease fatty foods increase activity.  -     Lipid panel - defer per patient   Hypothyroidism due to Hashimoto's thyroiditis Hypothyroidism-check TSH level, continue medications the same, reminded to take on an empty stomach 30-41mins before food.  -     TSH  Other abnormal glucose/insulin resistance  Discussed disease and risks Discussed diet/exercise, weight management        -     A1C - declines - checking CMP for glucose  Morbid obesity (HCC) - BMI 36 *** with hyperlipidemia, hypothyroid - long discussion about lifestyle, metabolic disease - plant based, minimally processed, low saturated fat, high fiber, mediterranean type diet and benefits reviewed - follow up 6 months for progress monitoring  B12 deficiency -     Vitamin B12 - defer  Medication management -     Magnesium- defer  Vitamin D deficiency Continue supplement -     VITAMIN D 25 Hydroxy (Vit-D Deficiency, Fractures) - defer  Former smoker/ ;current vaper Smoking cessation-  instruction/counseling given, counseled patient on the dangers of tobacco use, advised patient to stop smoking, and reviewed strategies to maximize success  History of migraine Monitor  Eczema, unspecified type .montior - elimination diet Topical steroid PRN-   Testosterone def Continued perceived benefits with supplementation, no SE. Check level PRN, continue zinc, no symptoms  of elevated estrogen or dihydrotestosterone.   Erectile dysfunction Sildenafil works well, take PRN *** Weight loss encouraged; discussed vascular mechanism  Left testicular mass - reassuring/benign workup, urology monitoring   Patient is uninsured and cash pay; shared decision making for lab.   Discussed med's effects and SE's. Screening labs and tests as requested with regular follow-up as recommended. Over 40 minutes of exam, counseling, chart review and critical decision making was performed  Future Appointments  Date Time Provider Department Center  08/02/2021  3:00 PM Judd Gaudier, NP [facility] None  08/06/2022  3:00 PM Revonda Humphrey, NP [facility] None    HPI 41 y.o. male patient presents for a complete physical. He has Hypertension; Hypothyroidism; Smoker; Morbid obesity (HCC); Vitamin D deficiency; Eczema; History of migraine; Hypercholesteremia; B12 deficiency; Medication management; Hypogonadism in male; Insulin resistance; Erectile dysfunction; Current every day nicotine vapor product user; and Sigmoid diverticulosis on their problem list.   He has male partner, 4 year, declines STD check, had negative check previously. 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   He reported ED sx at last visit, despite testosterone supplement. Admittedly poor lifestyle, but good relationship prior to onset. He was prescribed sildenafil and reports 50 mg typically works, but preferred tadalafil.   He had tender left testicular mass, had [place] that showed varicocele and cyst; has seen uro Dr. 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 He was reassured and monitoring.   He has history of depression/anxiety- has been on several agents in  the past, prozac, depakote, wellbutrin, xanax, zoloft. States he had been on 40 mg valium in a day. At one point he was on 5 meds for  depression/anxiety at one time.  The SSRI's he was having mood swings. He now uses lifestyle management/stress techniques and feels doing better. Recently has klonopin 1 mg BID PRN, last refill 30 tabs filled 11/08/2020, still has some left.   Smoker, has been vaping for many years, has reduced down to stick pod, gradually reducing, got to try to quit this year.   BMI is There is no height or weight on file to calculate BMI., he has not been working on diet and exercise, but does try to make better choices, tries to limit starches, eats more fresh fruit/veggies, choose whole grain, etc.  Wt Readings from Last 3 Encounters:  02/13/21 244 lb (110.7 kg)  09/27/20 250 lb (113.4 kg)  08/02/20 243 lb 12.8 oz (110.6 kg)   His blood pressure has been controlled at home (unsure of numbers but reports machine states "in good range"), reports taking lisinopril 40 mg regularly, has had BP x teens, today their BP is  .  He does not workout. He denies chest pain, shortness of breath, dizziness.   He has been working on diet/exercise for cholesterol, severe trig elevations improved but not controlled on fenofibrate.  Lab Results  Component Value Date   CHOL 143 02/13/2021   HDL 34 (L) 02/13/2021   LDLCALC 86 02/13/2021   TRIG 133 02/13/2021   CHOLHDL 4.2 02/13/2021   He has insulin resistance, has been on metformin 750 mg 1 tab daily. Last A1C in the office was:  Lab Results  Component Value Date   HGBA1C 5.4 04/13/2018   He is on thyroid medication. His medication was not changed last visit. He takes levothyroxine 75 mcg daily.   Lab Results  Component Value Date   TSH 4.23 02/13/2021   Patient is on Vitamin D supplement, taking 40814 IU weekly Lab Results  Component Value Date   VD25OH 92 11/01/2019     He has a history of testosterone deficiency and is on testosterone replacement, he has been taking 400 mg q2w, last injection 07/21/2020, 12 days ago. He states that the testosterone helps with  his energy, libido, muscle mass, but denies benefit with ED. Denies symptoms of elevated estrogen or dihydrotestosterone.  Lab Results  Component Value Date   TESTOSTERONE 556 09/01/2019   He had PSA checked due to ED last year, normal. Denies LUTS.  Lab Results  Component Value Date   PSA 2.0 07/22/2019      Current Medications:  Current Outpatient Medications on File Prior to Visit  Medication Sig Dispense Refill   Ascorbic Acid (VITAMIN C PO) Take by mouth daily.     clonazePAM (KLONOPIN) 1 MG tablet Take 1 tablet (1 mg total) by mouth 2 (two) times daily as needed for anxiety. 30 tablet 0   Cyanocobalamin (B-12) 500 MCG SUBL One daily 30 tablet 3   fenofibrate (TRICOR) 145 MG tablet Take  1 tablet  Daily  for Triglycerides (Blood Fats) 90 tablet 1   levothyroxine (SYNTHROID) 75 MCG tablet Take  1 tablet  Daily  on an empty stomach with only water for 30 minutes & no Antacid meds, Calcium or Magnesium for 4 hours & avoid Biotin 90 tablet 1   lisinopril (ZESTRIL) 40 MG tablet Take 1 tablet Daily for BP & Diabetic Kidney Protection 90 tablet 1   metFORMIN (  GLUCOPHAGE-XR) 750 MG 24 hr tablet Take 1 tablet (750 mg total) by mouth 2 (two) times daily with a meal. 180 tablet 1   NEEDLE, DISP, 21 G 21G X 1" MISC Use for testosterone injections, 3 ml 100 each 0   NEEDLE, DISP, 25 G 25G X 1" MISC Use IM every 1-2 weeks for testosterone injection 100 each 0   phentermine (ADIPEX-P) 37.5 MG tablet Take     1 tablet      every Morning       for Dieting & Weight Loss (Patient not taking: Reported on 02/13/2021) 90 tablet 0   sildenafil (VIAGRA) 100 MG tablet TAKE 1/2 TO 1 (ONE-HALF TO ONE) TABLET BY MOUTH 1 HOUR PRIOR TO INTERCOURSE AS NEEDED 30 tablet 0   testosterone cypionate (DEPOTESTOSTERONE CYPIONATE) 200 MG/ML injection inject 2mls into the muscle every 2 weeks 10 mL 0   Vitamin D, Ergocalciferol, (DRISDOL) 1.25 MG (50000 UNIT) CAPS capsule TAKE 1 CAPSULE BY MOUTH EVERY WEEK 8 capsule 3    VITAMIN E PO Take by mouth daily.     zinc gluconate 50 MG tablet Take 1 tablet (50 mg total) by mouth daily.     No current facility-administered medications on file prior to visit.   Allergies:  No Known Allergies   Health Maintenance:  There is no immunization history for the selected administration types on file for this patient.  Tetanus: DUE but declines Pneumovax: Prevnar 13: Flu vaccine: DECLINES Shingrix: discuss at age 41 Covid 7019: had infection unvaccinated, was mild, declines vaccine  Colonoscopy: due age 41 EGD:  Last eye:  Last dental:   Patient Care Team: Judd Gaudierorbett, Trey Bebee, NP as PCP - General (Nurse Practitioner)  Medical History:  has Hypertension; Hypothyroidism; Smoker; Morbid obesity (HCC); Vitamin D deficiency; Eczema; History of migraine; Hypercholesteremia; B12 deficiency; Medication management; Hypogonadism in male; Insulin resistance; Erectile dysfunction; Current every day nicotine vapor product user; and Sigmoid diverticulosis on their problem list.   Surgical History:  He  has a past surgical history that includes No past surgeries. Family History:  His family history includes AAA (abdominal aortic aneurysm) in his maternal grandmother; Aneurysm (age of onset: 3740) in his father; Cancer in his maternal grandfather and mother; Crohn's disease in his sister; Diabetes in his paternal aunt, paternal grandmother, and paternal uncle; Hyperlipidemia in his paternal grandmother; Hypertension in his father and sister; Stroke in his father, maternal grandmother, and paternal grandfather. Social History:   reports that he quit smoking about 8 years ago. His smoking use included cigarettes. He has a 20.00 pack-year smoking history. He has never used smokeless tobacco. He reports that he does not currently use alcohol. He reports that he does not currently use drugs after having used the following drugs: Marijuana, Amphetamines, Cocaine, and "Crack" cocaine.     Review of Systems:  Review of Systems  Constitutional:  Positive for malaise/fatigue. Negative for chills, diaphoresis, fever and weight loss.  HENT: Negative.  Negative for hearing loss and tinnitus.   Eyes: Negative.  Negative for blurred vision and double vision.  Respiratory: Negative.  Negative for cough, shortness of breath and wheezing.   Cardiovascular: Negative.  Negative for chest pain, palpitations, orthopnea, claudication and leg swelling.  Gastrointestinal: Negative.  Negative for abdominal pain, blood in stool, constipation, diarrhea, heartburn, melena, nausea and vomiting.  Genitourinary: Negative.  Negative for dysuria, flank pain, frequency, hematuria and urgency.       Difficulty with erection, left testicular mass  Musculoskeletal: Negative.  Negative for joint pain and myalgias.  Skin: Negative.  Negative for rash (chronic, scalp, ).  Neurological:  Negative for dizziness, tingling, sensory change, weakness and headaches.  Endo/Heme/Allergies:  Negative for polydipsia.  Psychiatric/Behavioral:  Negative for depression, substance abuse and suicidal ideas. The patient is nervous/anxious. The patient does not have insomnia.   All other systems reviewed and are negative.  Physical Exam: Estimated body mass index is 36.03 kg/m as calculated from the following:   Height as of 09/27/20: 5\' 9"  (1.753 m).   Weight as of 02/13/21: 244 lb (110.7 kg). There were no vitals taken for this visit. General Appearance: Well nourished, well dressed adult male in no apparent distress.  Eyes: PERRLA, EOMs, conjunctiva no swelling or erythema, normal fundi and vessels.  Sinuses: No Frontal/maxillary tenderness  ENT/Mouth: Ext aud canals clear, normal light reflex with TMs without erythema, bulging. Good dentition. No erythema, swelling, or exudate on post pharynx. Tonsils not swollen or erythematous. Hearing normal.  Neck: Supple, thyroid normal. No bruits  Respiratory: Respiratory  effort normal, BS equal bilaterally without rales, rhonchi, wheezing or stridor.  Cardio: RRR without murmurs, rubs or gallops. Brisk peripheral pulses without edema.  Chest: symmetric, with normal excursions and percussion.  Abdomen: Soft, obese abdomen, nontender, no guarding, rebound, hernias, masses, or organomegaly.  Lymphatics: Non tender without lymphadenopathy.  Musculoskeletal: Full ROM all peripheral extremities,5/5 strength, and normal gait.  Skin: Warm, dry without lesions, ecchymosis. He has flaky rash with erythematous base, some scaling, left post-auricular area, left jaw/neck Neuro: Cranial nerves intact, reflexes equal bilaterally. Normal muscle tone, no cerebellar symptoms. Sensation intact.  Psych: Awake and oriented X 3, normal affect, Insight and Judgment appropriate.  GU: Male genitalia: no penile lesions or discharge no bladder distension noted Penis: normal, no lesions, circumcised and frenulum piercing intact Urethral Meatus: normal Testicles: atrophic: right and mass 2-3 cmcm: left Scrotum: normal Epididymis: swelling: left   EKG: WNL no ST changes in 2020 - defer  2021 Kieley Akter 1:48 PM [facility]

## 2021-08-16 ENCOUNTER — Other Ambulatory Visit: Payer: Self-pay | Admitting: Adult Health

## 2021-08-16 ENCOUNTER — Other Ambulatory Visit: Payer: Self-pay | Admitting: Nurse Practitioner

## 2021-08-16 DIAGNOSIS — F419 Anxiety disorder, unspecified: Secondary | ICD-10-CM

## 2021-10-22 NOTE — Progress Notes (Unsigned)
    Future Appointments  Date Time Provider Department Center  10/23/2021  9:30 AM Lucky Cowboy, MD [facility] None    History of Present Illness:     Patient is  a 41 yo BM with HTN, Morbid Obesity,  T2_NIDDM, Hypothyroidism, B12 & Vit D def , Testosterone Deficiency who was last seen Dec 2022 for     Medications  Current Outpatient Medications (Endocrine & Metabolic):    levothyroxine (SYNTHROID) 75 MCG tablet, Take  1 tablet  Daily  on an empty stomach with only water for 30 minutes & no Antacid meds, Calcium or Magnesium for 4 hours & avoid Biotin   metFORMIN (GLUCOPHAGE-XR) 750 MG 24 hr tablet, Take 1 tablet (750 mg total) by mouth 2 (two) times daily with a meal.   testosterone cypionate (DEPOTESTOSTERONE CYPIONATE) 200 MG/ML injection, inject into the muscle every 2 weeks  Current Outpatient Medications (Cardiovascular):    fenofibrate (TRICOR) 145 MG tablet, Take  1 tablet  Daily  for Triglycerides (Blood Fats)   lisinopril (ZESTRIL) 40 MG tablet, Take 1 tablet Daily for BP & Diabetic Kidney Protection   sildenafil (VIAGRA) 100 MG tablet, TAKE 1/2 TO 1 (ONE-HALF TO ONE) TABLET BY MOUTH 1 HOUR PRIOR TO INTERCOURSE AS NEEDED    Current Outpatient Medications (Hematological):    Cyanocobalamin (B-12) 500 MCG SUBL, One daily  Current Outpatient Medications (Other):    Ascorbic Acid (VITAMIN C PO), Take by mouth daily.   clonazePAM (KLONOPIN) 1 MG tablet, Take 1 tablet (1 mg total) by mouth 2 (two) times daily as needed for anxiety.   NEEDLE, DISP, 21 G 21G X 1" MISC, Use for testosterone injections, 3 ml   NEEDLE, DISP, 25 G 25G X 1" MISC, Use IM every 1-2 weeks for testosterone injection   phentermine (ADIPEX-P) 37.5 MG tablet, Take     1 tablet      every Morning       for Dieting & Weight Loss (Patient not taking: Reported on 02/13/2021)   Vitamin D, Ergocalciferol, (DRISDOL) 1.25 MG (50000 UNIT) CAPS capsule, TAKE 1 CAPSULE BY MOUTH EVERY WEEK   VITAMIN E PO, Take  by mouth daily.   zinc gluconate 50 MG tablet, Take 1 tablet (50 mg total) by mouth daily.  Problem list He has Hypertension; Hypothyroidism; Smoker; Morbid obesity (HCC); Vitamin D deficiency; Eczema; History of migraine; Hypercholesteremia; B12 deficiency; Medication management; Hypogonadism in male; Insulin resistance; Erectile dysfunction; Current every day nicotine vapor product user; and Sigmoid diverticulosis on their problem list.   Observations/Objective:  There were no vitals taken for this visit.  HEENT - WNL. Neck - supple.  Chest - Clear equal BS. Cor - Nl HS. RRR w/o sig MGR. PP 1(+). No edema. MS- FROM w/o deformities.  Gait Nl. Neuro -  Nl w/o focal abnormalities.   Assessment and Plan:      Follow Up Instructions:        I discussed the assessment and treatment plan with the patient. The patient was provided an opportunity to ask questions and all were answered. The patient agreed with the plan and demonstrated an understanding of the instructions.       The patient was advised to call back or seek an in-person evaluation if the symptoms worsen or if the condition fails to improve as anticipated.    Marinus Maw, MD

## 2021-10-23 ENCOUNTER — Ambulatory Visit (INDEPENDENT_AMBULATORY_CARE_PROVIDER_SITE_OTHER): Payer: Self-pay | Admitting: Internal Medicine

## 2021-10-23 ENCOUNTER — Encounter: Payer: Self-pay | Admitting: Internal Medicine

## 2021-10-23 VITALS — BP 172/98 | HR 118 | Temp 97.5°F | Resp 16 | Ht 69.0 in | Wt 237.0 lb

## 2021-10-23 DIAGNOSIS — I10 Essential (primary) hypertension: Secondary | ICD-10-CM

## 2021-10-23 DIAGNOSIS — E039 Hypothyroidism, unspecified: Secondary | ICD-10-CM

## 2021-10-23 DIAGNOSIS — E1122 Type 2 diabetes mellitus with diabetic chronic kidney disease: Secondary | ICD-10-CM

## 2021-10-23 DIAGNOSIS — J029 Acute pharyngitis, unspecified: Secondary | ICD-10-CM

## 2021-10-23 MED ORDER — METFORMIN HCL ER 500 MG PO TB24: ORAL_TABLET | ORAL | 0 refills | Status: AC

## 2021-10-23 MED ORDER — OLMESARTAN MEDOXOMIL 40 MG PO TABS: ORAL_TABLET | ORAL | 0 refills | Status: AC

## 2021-10-23 MED ORDER — LEVOTHYROXINE SODIUM 75 MCG PO TABS: ORAL_TABLET | ORAL | 0 refills | Status: AC

## 2021-10-23 MED ORDER — AZITHROMYCIN 250 MG PO TABS: ORAL_TABLET | ORAL | 1 refills | Status: DC

## 2021-11-27 IMAGING — CT CT PELVIS W/O CM
2 of 4 series · 14 of 46 positions shown, 16 images · non-contrast
Comparison: Ultrasound September 01, 2020

CLINICAL DATA: Further evaluation of left testicle/inguinal canal
for hernia versus varicocele.

EXAM:
CT PELVIS WITHOUT CONTRAST
TECHNIQUE: Multidetector CT imaging of the pelvis was performed following the
standard protocol without intravenous contrast.

[Series 3: coronal st · coronal · 0.58mm/px · 3 of 158 slices shown]
[im 53/158  soft-tissue]
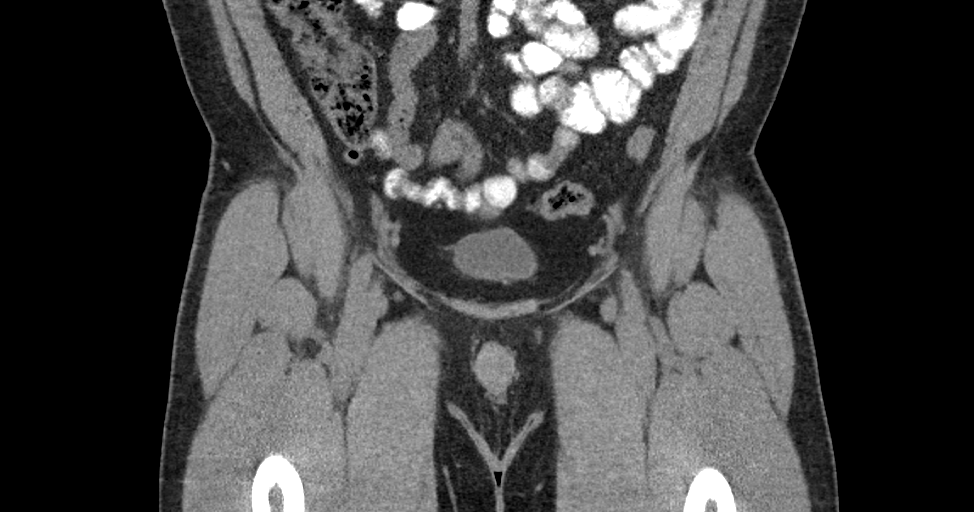
[im 70/158  soft-tissue]
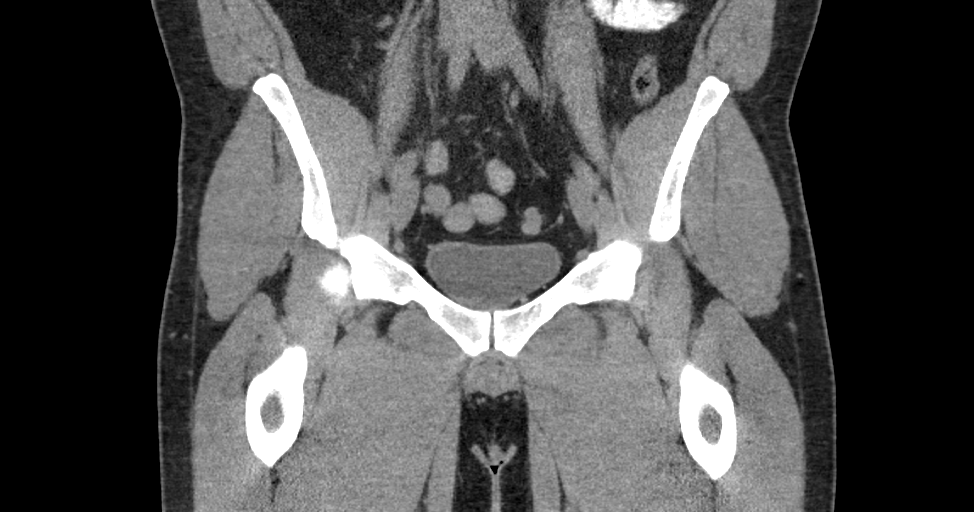
[im 88/158  soft-tissue]
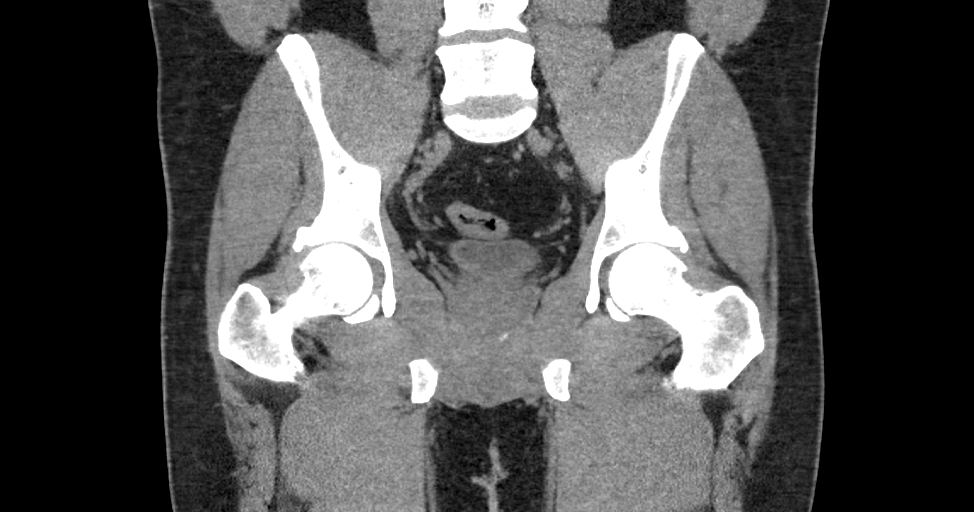

[Series 6: axial st · axial · 0.89mm/px · z∈[-314,-58]mm · 11 of 148 slices shown, 13 images]
[im 10/148  soft-tissue]
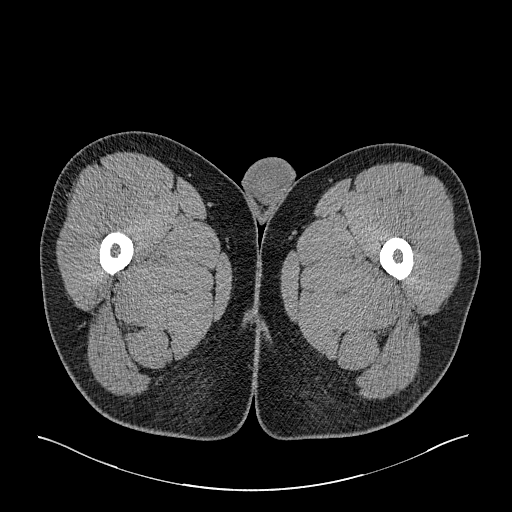
[im 10/148  bone]
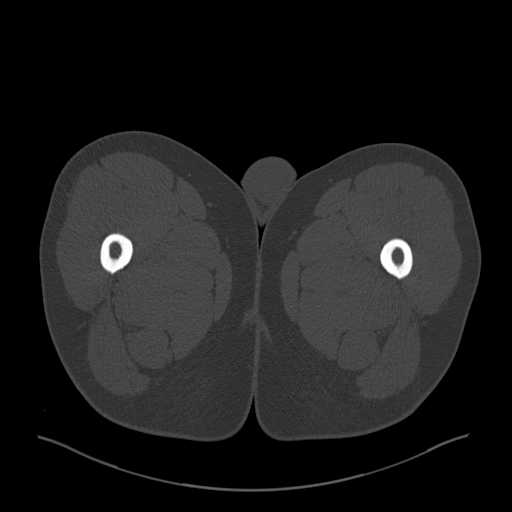
[im 20/148  soft-tissue]
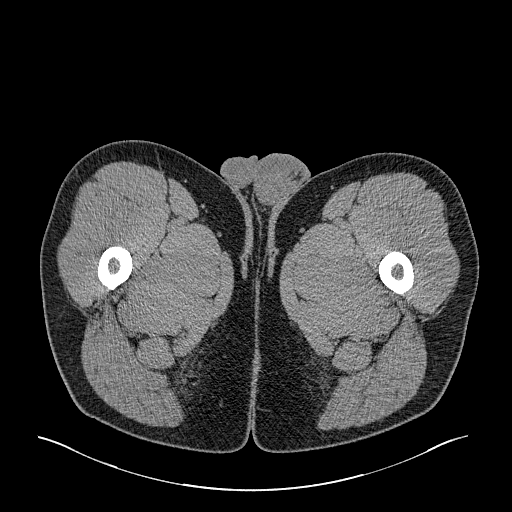
[im 40/148  soft-tissue]
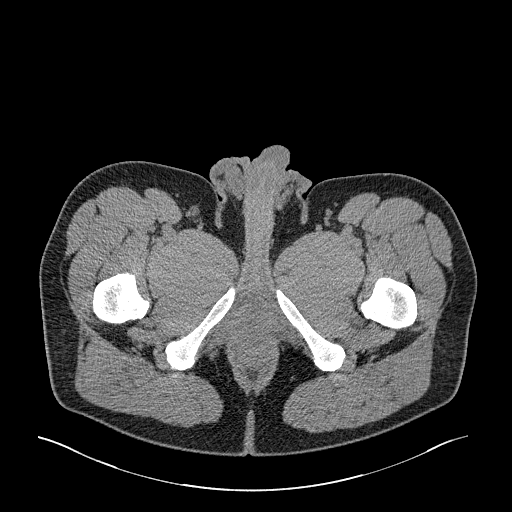
[im 50/148  soft-tissue]
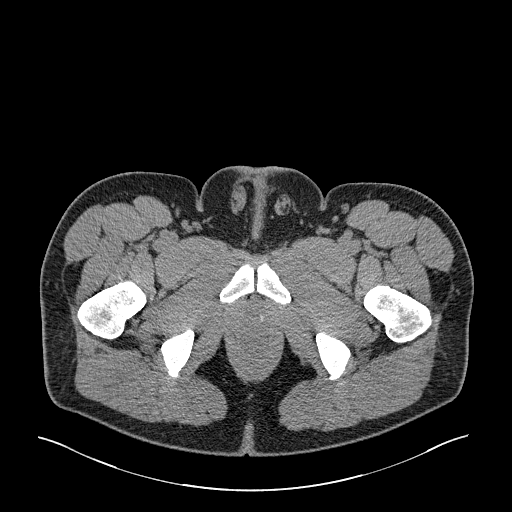
[im 59/148  soft-tissue]
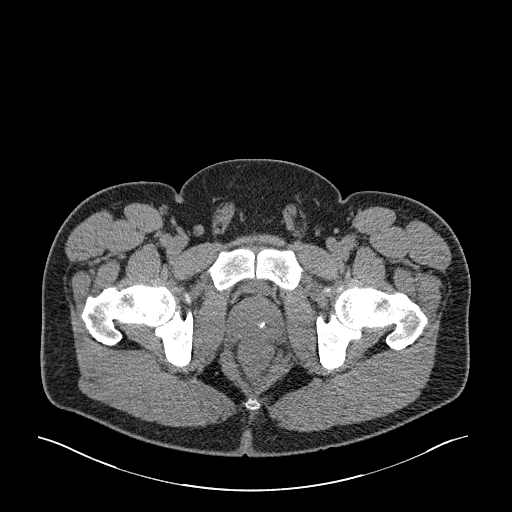
[im 79/148  soft-tissue]
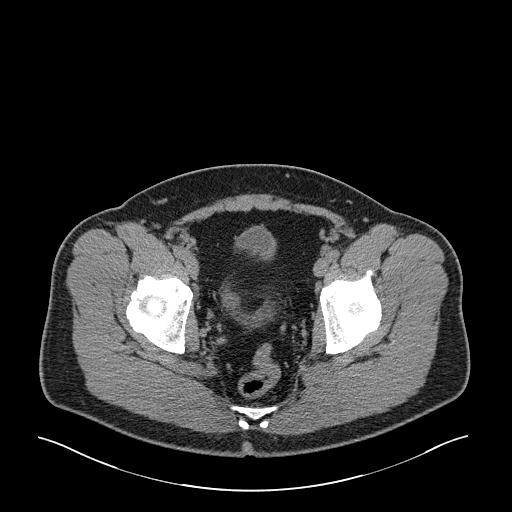
[im 89/148  soft-tissue]
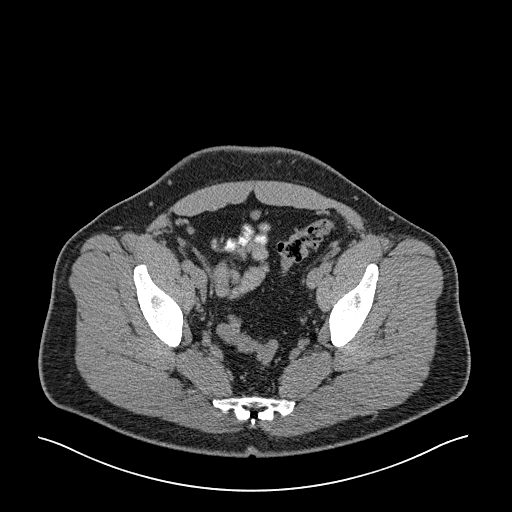
[im 99/148  soft-tissue]
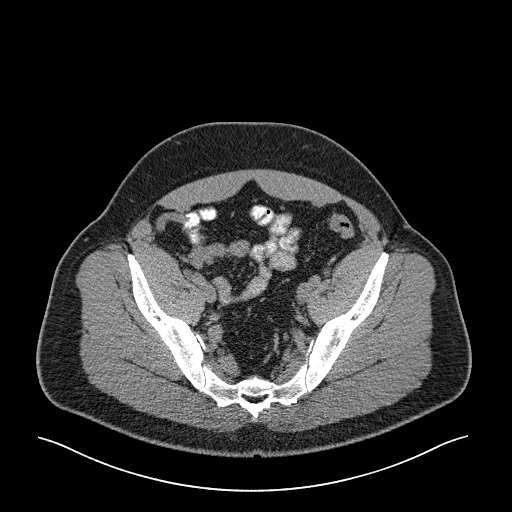
[im 108/148  soft-tissue]
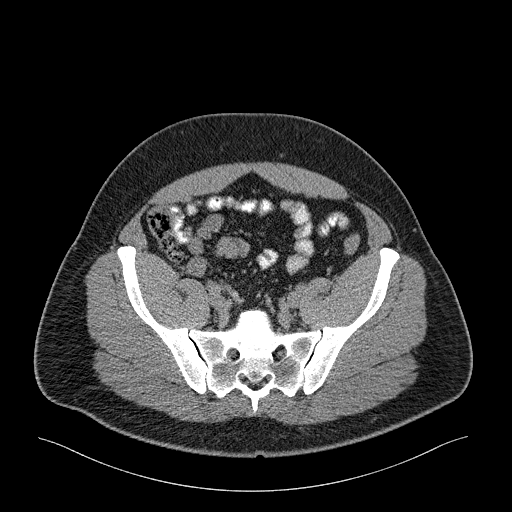
[im 108/148  bone]
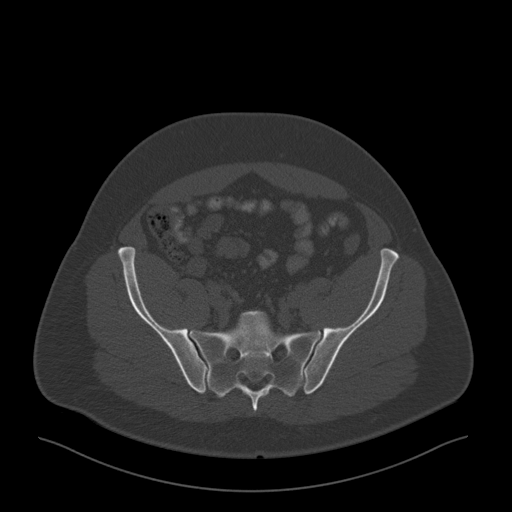
[im 128/148  soft-tissue]
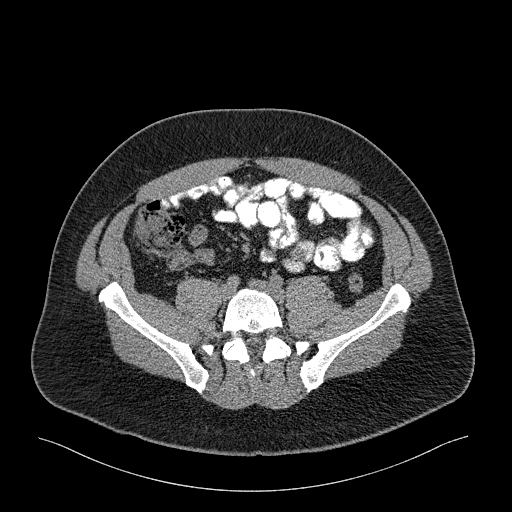
[im 138/148  soft-tissue]
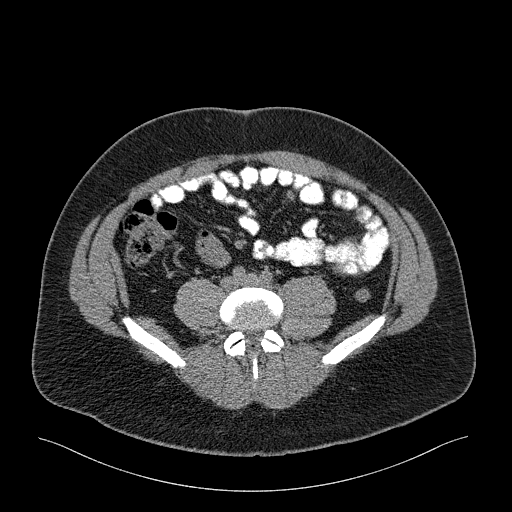

[14 of 46 positions shown; findings below may reference images not displayed]

FINDINGS: Urinary Tract: There is a urine filled tubular extension from the
anterior superior aspect of the bladder dome as seen on image 70/4
which is in continuity with a thin band of tissue which extends to
the umbilicus, possibly reflecting a urachal diverticulum.

Bowel: Radiopaque enteric contrast traverses the distal small bowel.
The appendix and terminal ileum appear normal. No pathologic
dilation of small or large bowel in the pelvis. Sigmoid colonic
diverticulosis without findings of acute diverticulitis.

Vascular/Lymphatic: No pathologically enlarged lymph nodes. No
significant vascular abnormality seen.

Reproductive: Dystrophic prostatic calcifications. Prominence of the
left pampiniform plexus within ill-defined area of soft tissue
nodularity along the distal aspect of the pampiniform plexus
posterior to the testicle for instance on image 131/6.

Other:  No inguinal hernia visualized.  No pelvic free fluid.

Musculoskeletal: No acute osseous abnormality.
IMPRESSION: 1. No inguinal hernia visualized.
2. Prominence of the left pampiniform plexus within ill-defined area
of soft tissue nodularity along the distal aspect of the pampiniform
plexus posterior to the testicle, findings which are nonspecific and
poorly evaluated on CT. Consider repeat scrotal ultrasound and
urology consult.
3. There is a urine filled tubular extension from the anterior
superior aspect of the bladder dome which is in continuity with a
thin band of tissue which extends to the umbilicus, possibly
reflecting a urachal diverticulum.
4. Sigmoid colonic diverticulosis without findings of acute
diverticulitis.

## 2021-11-28 ENCOUNTER — Ambulatory Visit: Payer: Self-pay | Admitting: Internal Medicine

## 2021-11-28 NOTE — Progress Notes (Signed)
N  O        S  H  O  W  Future Appointments  Date Time Provider Department  11/28/2021  4:00 PM Lucky Cowboy, MD [facility]    History of Present Illness:      Patient is  a 41 yo man with HTN, Morbid Obesity,  T2_NIDDM, Hypothyroidism, B12 & Vit D def , Testosterone Deficiency who was last seen Dec 2022 presents now with c/o of a "sore throat".  Patient admits poor compliance with all of his med  likely due to lack of health insurance.        Medications  Current Outpatient Medications (Endocrine & Metabolic):    levothyroxine (SYNTHROID) 75 MCG tablet, Take  1 tablet  Daily  on an empty stomach with only water for 30 minutes & no Antacid meds, Calcium or Magnesium for 4 hours & avoid Biotin   metFORMIN  (GLUCOPHAGE-XR) 500 MG 24 hr tablet, Take  2 tablets   2 x /day  with Meals  for Diabetes   testosterone cypionate (DEPOTESTOSTERONE CYPIONATE) 200 MG/ML injection, inject into the muscle every 2 weeks  Current Outpatient Medications (Cardiovascular):    olmesartan (BENICAR) 40 MG tablet, Take 1 tablet Daily for BP & Diabetic Kidney Protection   sildenafil (VIAGRA) 100 MG tablet, TAKE 1/2 TO 1 (ONE-HALF TO ONE) TABLET BY MOUTH 1 HOUR PRIOR TO INTERCOURSE AS NEEDED    Current Outpatient Medications (Hematological):    Cyanocobalamin (B-12) 500 MCG SUBL, One daily  Current Outpatient Medications (Other):    Ascorbic Acid (VITAMIN C PO), Take by mouth daily.   NEEDLE, DISP, 21 G 21G X 1" MISC, Use for testosterone injections, 3 ml   zinc gluconate 50 MG tablet, Take 1 tablet (50 mg total) by mouth daily.  Problem list He has Hypertension; Hypothyroidism; Smoker; Morbid obesity (HCC); Vitamin D deficiency; Eczema; History of migraine; Hypercholesteremia; B12 deficiency; Medication management; Hypogonadism in male; Insulin resistance; Erectile dysfunction; Current every day nicotine vapor product user; and Sigmoid diverticulosis on their problem list.   Observations/Objective:  There were no vitals taken for this visit.  HEENT - WNL. Neck - supple.  Chest - Clear equal BS. Cor - Nl HS. RRR w/o sig MGR. PP 1(+). No edema. MS- FROM w/o deformities.  Gait Nl. Neuro -  Nl w/o focal abnormalities.   Assessment and Plan:      Follow Up Instructions:        I discussed the assessment and treatment plan with the patient. The patient was provided an opportunity to ask questions and all were answered. The patient agreed with the plan and demonstrated an understanding of the instructions.       The patient was advised to call back or seek an in-person evaluation if the symptoms worsen or if the condition fails to improve as anticipated.    Marinus Maw, MD

## 2021-12-12 NOTE — Progress Notes (Unsigned)
      History of Present Illness:      Patient is  a 41 yo man with HTN, Morbid Obesity,  T2_NIDDM, Hypothyroidism, B12 & Vit D def , Testosterone Deficiency who was last seen Dec 2022 presents now with c/o of a "sore throat".  Patient admits poor compliance with all of his med  likely due to lack of health insurance. He was given phone # to contact [employer] to hopefully get a better job with insurance benefits & he threw the paper with the number in the trash can.      Patient admitted that he had not been taking his diabetic,  BP or thyroid meds consistently and during attempt  to explain the importance of treating and monitoring his diseases  & a better diet,  he stood up & walked out of the exam room w/o explanation.    Medications    levothyroxine 75 MCG tablet, Take  1 tablet  Daily     metFORMIN XR 500 MG Take  2 tablets   2 x /day  with Meals  for Diabetes   olmesartan                                                                                                                                                             40 MG tablet, Take 1 tablet Daily for BP & Diabetic Kidney Protection   Vitamin B-12 500 mcg SL, One daily   VITAMIN C, Take daily.   zinc 50 MG tablet, Take 1 tablet daily.  Problem list He has Hypertension; Hypothyroidism; Smoker; Morbid obesity ([place]); Vitamin D deficiency; Eczema; History of migraine; Hypercholesteremia; B12 deficiency; Medication management; Hypogonadism in male; Insulin resistance; Erectile dysfunction; Current every day nicotine vapor product user; and Sigmoid diverticulosis on their problem list.   Observations/Objective:  BP 130/80   Pulse 100   Temp 97.9 F (36.6 C)   Resp 16   Ht 5\' 9"  (1.753 m)   Wt 249 lb (112.9 kg)   SpO2 99%   BMI 36.77 kg/m   No exam allowed by patient    Assessment and Plan:  1. Non compliance with medical treatment   2. Essential hypertension  3. Hyperlipidemia associated with type 2 diabetes  mellitus ([place])  4. Type 2 diabetes mellitus with stage 2 chronic kidney disease, without long-term current use of insulin ([place])  5. Vitamin D deficiency  6. Hypothyroidism  7. Testosterone deficiency    Kirtland Bouchard, MD

## 2021-12-12 NOTE — Patient Instructions (Signed)
Due to recent changes in healthcare laws, you may see the results of your imaging and laboratory studies on MyChart before your provider has had a chance to review them.  We understand that in some cases there may be results that are confusing or concerning to you. Not all laboratory results come back in the same time frame and the provider may be waiting for multiple results in order to interpret others.  Please give us 48 hours in order for your provider to thoroughly review all the results before contacting the office for clarification of your results.  ++++++++++++++++++++++++++  Vit D  & Vit C 1,000 mg   are recommended to help protect  against the Covid-19 and other Corona viruses.    Also it's recommended  to take  Zinc 50 mg  to help  protect against the Covid-19   and best place to get  is also on [employer]  and don't pay more than 6-8 cents /pill !   +++++++++++++++++++++++++++++++++++++++ Recommend Adult Low Dose Aspirin or  coated  Aspirin 81 mg daily  To reduce risk of Colon Cancer 40 %,  Skin Cancer 26 % ,  Melanoma 46%  and  Pancreatic cancer 60% +++++++++++++++++++++++++++++++++++++++++ Vitamin D goal  is between 70-100.  Please make sure that you are taking your Vitamin D as directed.  It is very important as a natural anti-inflammatory  helping hair, skin, and nails, as well as reducing stroke and heart attack risk.  It helps your bones and helps with mood. It also decreases numerous cancer risks so please take it as directed.  Low Vit D is associated with a 200-300% higher risk for CANCER  and 200-300% higher risk for HEART   ATTACK  &  STROKE.   ...................................... It is also associated with higher death rate at younger ages,  autoimmune diseases like Rheumatoid arthritis, Lupus, Multiple Sclerosis.    Also many other serious conditions, like depression, Alzheimer's Dementia, infertility, muscle aches, fatigue, fibromyalgia - just to name  a few. +++++++++++++++++++++++++++++++++++++++++ Recommend the book "The END of DIETING" by Dr Joel Fuhrman  & the book "The END of DIABETES " by Dr Joel Fuhrman At [employer] - get book & Audio CD's    Being diabetic has a  300% increased risk for heart attack, stroke, cancer, and alzheimer- type vascular dementia. It is very important that you work harder with diet by avoiding all foods that are white. Avoid white rice (Juliana & wild rice is OK), white potatoes (sweetpotatoes in moderation is OK), White bread or wheat bread or anything made out of white flour like bagels, donuts, rolls, buns, biscuits, cakes, pastries, cookies, pizza crust, and pasta (made from white flour & egg whites) - vegetarian pasta or spinach or wheat pasta is OK. Multigrain breads like Arnold's or Pepperidge Farm, or multigrain sandwich thins or flatbreads.  Diet, exercise and weight loss can reverse and cure diabetes in the early stages.  Diet, exercise and weight loss is very important in the control and prevention of complications of diabetes which affects every system in your body, ie. Brain - dementia/stroke, eyes - glaucoma/blindness, heart - heart attack/heart failure, kidneys - dialysis, stomach - gastric paralysis, intestines - malabsorption, nerves - severe painful neuritis, circulation - gangrene & loss of a leg(s), and finally cancer and Alzheimers.    I recommend avoid fried & greasy foods,  sweets/candy, white rice (Rebello or wild rice or Quinoa is OK), white potatoes (sweet potatoes are OK) - anything   made from white flour - bagels, doughnuts, rolls, buns, biscuits,white and wheat breads, pizza crust and traditional pasta made of white flour & egg white(vegetarian pasta or spinach or wheat pasta is OK).  Multi-grain bread is OK - like multi-grain flat bread or sandwich thins. Avoid alcohol in excess. Exercise is also important.    Eat all the vegetables you want - avoid meat, especially red meat and dairy - especially  cheese.  Cheese is the most concentrated form of trans-fats which is the worst thing to clog up our arteries. Veggie cheese is OK which can be found in the fresh produce section at [employer] or [employer] or [employer]  +++++++++++++++++++++++++++++++++++++++ DASH Eating Plan  DASH stands for "Dietary Approaches to Stop Hypertension."   The DASH eating plan is a healthy eating plan that has been shown to reduce high blood pressure (hypertension). Additional health benefits may include reducing the risk of type 2 diabetes mellitus, heart disease, and stroke. The DASH eating plan may also help with weight loss. WHAT DO I NEED TO KNOW ABOUT THE DASH EATING PLAN? For the DASH eating plan, you will follow these general guidelines: Choose foods with a percent daily value for sodium of less than 5% (as listed on the food label). Use salt-free seasonings or herbs instead of table salt or sea salt. Check with your health care provider or pharmacist before using salt substitutes. Eat lower-sodium products, often labeled as "lower sodium" or "no salt added." Eat fresh foods. Eat more vegetables, fruits, and low-fat dairy products. Choose whole grains. Look for the word "whole" as the first word in the ingredient list. Choose fish  Limit sweets, desserts, sugars, and sugary drinks. Choose heart-healthy fats. Eat veggie cheese  Eat more home-cooked food and less restaurant, buffet, and fast food. Limit fried foods. Cook foods using methods other than frying. Limit canned vegetables. If you do use them, rinse them well to decrease the sodium. When eating at a restaurant, ask that your food be prepared with less salt, or no salt if possible.                      WHAT FOODS CAN I EAT? Read Dr Joel Fuhrman's books on The End of Dieting & The End of Diabetes  Grains Whole grain or whole wheat bread. Rolon rice. Whole grain or whole wheat pasta. Quinoa, bulgur, and whole grain cereals. Low-sodium  cereals. Corn or whole wheat flour tortillas. Whole grain cornbread. Whole grain crackers. Low-sodium crackers.  Vegetables Fresh or frozen vegetables (raw, steamed, roasted, or grilled). Low-sodium or reduced-sodium tomato and vegetable juices. Low-sodium or reduced-sodium tomato sauce and paste. Low-sodium or reduced-sodium canned vegetables.   Fruits All fresh, canned (in natural juice), or frozen fruits.  Protein Products  All fish and seafood.  Dried beans, peas, or lentils. Unsalted nuts and seeds. Unsalted canned beans.  Dairy Low-fat dairy products, such as skim or 1% milk, 2% or reduced-fat cheeses, low-fat ricotta or cottage cheese, or plain low-fat yogurt. Low-sodium or reduced-sodium cheeses.  Fats and Oils Tub margarines without trans fats. Light or reduced-fat mayonnaise and salad dressings (reduced sodium). Avocado. Safflower, olive, or canola oils. Natural peanut or almond butter.  Other Unsalted popcorn and pretzels. The items listed above may not be a complete list of recommended foods or beverages. Contact your dietitian for more options.  +++++++++++++++  WHAT FOODS ARE NOT RECOMMENDED? Grains/ White flour or wheat flour White bread. White pasta. White rice. Refined   cornbread. Bagels and croissants. Crackers that contain trans fat.  Vegetables  Creamed or fried vegetables. Vegetables in a . Regular canned vegetables. Regular canned tomato sauce and paste. Regular tomato and vegetable juices.  Fruits Dried fruits. Canned fruit in light or heavy syrup. Fruit juice.  Meat and Other Protein Products Meat in general - RED meat & White meat.  Fatty cuts of meat. Ribs, chicken wings, all processed meats as bacon, sausage, bologna, salami, fatback, hot dogs, bratwurst and packaged luncheon meats.  Dairy Whole or 2% milk, cream, half-and-half, and cream cheese. Whole-fat or sweetened yogurt. Full-fat cheeses or blue cheese. Non-dairy creamers and whipped toppings.  Processed cheese, cheese spreads, or cheese curds.  Condiments Onion and garlic salt, seasoned salt, table salt, and sea salt. Canned and packaged gravies. Worcestershire sauce. Tartar sauce. Barbecue sauce. Teriyaki sauce. Soy sauce, including reduced sodium. Steak sauce. Fish sauce. Oyster sauce. Cocktail sauce. Horseradish. Ketchup and mustard. Meat flavorings and tenderizers. Bouillon cubes. Hot sauce. Tabasco sauce. Marinades. Taco seasonings. Relishes.  Fats and Oils Butter, stick margarine, lard, shortening and bacon fat. Coconut, palm kernel, or palm oils. Regular salad dressings.  Pickles and olives. Salted popcorn and pretzels.  The items listed above may not be a complete list of foods and beverages to avoid.  

## 2021-12-13 ENCOUNTER — Encounter: Payer: Self-pay | Admitting: Internal Medicine

## 2021-12-13 ENCOUNTER — Ambulatory Visit: Payer: Self-pay | Admitting: Internal Medicine

## 2021-12-13 VITALS — BP 130/80 | HR 100 | Temp 97.9°F | Resp 16 | Ht 69.0 in | Wt 249.0 lb

## 2021-12-13 DIAGNOSIS — I10 Essential (primary) hypertension: Secondary | ICD-10-CM

## 2021-12-13 DIAGNOSIS — Z79899 Other long term (current) drug therapy: Secondary | ICD-10-CM

## 2021-12-13 DIAGNOSIS — E1169 Type 2 diabetes mellitus with other specified complication: Secondary | ICD-10-CM

## 2021-12-13 DIAGNOSIS — E039 Hypothyroidism, unspecified: Secondary | ICD-10-CM

## 2021-12-13 DIAGNOSIS — E349 Endocrine disorder, unspecified: Secondary | ICD-10-CM

## 2021-12-13 DIAGNOSIS — E1122 Type 2 diabetes mellitus with diabetic chronic kidney disease: Secondary | ICD-10-CM

## 2021-12-13 DIAGNOSIS — E559 Vitamin D deficiency, unspecified: Secondary | ICD-10-CM

## 2022-08-06 ENCOUNTER — Encounter: Payer: Self-pay | Admitting: Nurse Practitioner

## 2022-08-21 DIAGNOSIS — E039 Hypothyroidism, unspecified: Secondary | ICD-10-CM | POA: Diagnosis not present

## 2022-08-21 DIAGNOSIS — R7303 Prediabetes: Secondary | ICD-10-CM | POA: Diagnosis not present

## 2022-08-21 DIAGNOSIS — I10 Essential (primary) hypertension: Secondary | ICD-10-CM | POA: Diagnosis not present

## 2022-08-21 DIAGNOSIS — F1910 Other psychoactive substance abuse, uncomplicated: Secondary | ICD-10-CM | POA: Diagnosis not present

## 2022-08-21 DIAGNOSIS — E781 Pure hyperglyceridemia: Secondary | ICD-10-CM | POA: Diagnosis not present

## 2022-08-23 DIAGNOSIS — Z125 Encounter for screening for malignant neoplasm of prostate: Secondary | ICD-10-CM | POA: Diagnosis not present

## 2022-09-03 DIAGNOSIS — I10 Essential (primary) hypertension: Secondary | ICD-10-CM | POA: Diagnosis not present

## 2022-09-03 DIAGNOSIS — R600 Localized edema: Secondary | ICD-10-CM | POA: Diagnosis not present

## 2022-09-03 DIAGNOSIS — L03116 Cellulitis of left lower limb: Secondary | ICD-10-CM | POA: Diagnosis not present

## 2022-09-03 DIAGNOSIS — E291 Testicular hypofunction: Secondary | ICD-10-CM | POA: Diagnosis not present

## 2022-10-01 DIAGNOSIS — E781 Pure hyperglyceridemia: Secondary | ICD-10-CM | POA: Diagnosis not present

## 2022-10-01 DIAGNOSIS — F411 Generalized anxiety disorder: Secondary | ICD-10-CM | POA: Diagnosis not present

## 2022-10-01 DIAGNOSIS — I10 Essential (primary) hypertension: Secondary | ICD-10-CM | POA: Diagnosis not present

## 2022-10-01 DIAGNOSIS — F1910 Other psychoactive substance abuse, uncomplicated: Secondary | ICD-10-CM | POA: Diagnosis not present

## 2022-10-23 DIAGNOSIS — U071 COVID-19: Secondary | ICD-10-CM | POA: Diagnosis not present

## 2023-01-08 DIAGNOSIS — Z0000 Encounter for general adult medical examination without abnormal findings: Secondary | ICD-10-CM | POA: Diagnosis not present

## 2023-01-17 DIAGNOSIS — E781 Pure hyperglyceridemia: Secondary | ICD-10-CM | POA: Diagnosis not present

## 2023-01-17 DIAGNOSIS — I10 Essential (primary) hypertension: Secondary | ICD-10-CM | POA: Diagnosis not present

## 2023-01-17 DIAGNOSIS — F1910 Other psychoactive substance abuse, uncomplicated: Secondary | ICD-10-CM | POA: Diagnosis not present

## 2023-01-17 DIAGNOSIS — E039 Hypothyroidism, unspecified: Secondary | ICD-10-CM | POA: Diagnosis not present

## 2023-01-17 DIAGNOSIS — R7303 Prediabetes: Secondary | ICD-10-CM | POA: Diagnosis not present

## 2023-05-13 DIAGNOSIS — Z6836 Body mass index (BMI) 36.0-36.9, adult: Secondary | ICD-10-CM | POA: Diagnosis not present

## 2023-05-13 DIAGNOSIS — J069 Acute upper respiratory infection, unspecified: Secondary | ICD-10-CM | POA: Diagnosis not present

## 2023-05-13 DIAGNOSIS — I10 Essential (primary) hypertension: Secondary | ICD-10-CM | POA: Diagnosis not present

## 2023-10-15 ENCOUNTER — Encounter (HOSPITAL_COMMUNITY): Payer: Self-pay

## 2023-10-15 ENCOUNTER — Other Ambulatory Visit: Payer: Self-pay

## 2023-10-15 ENCOUNTER — Ambulatory Visit (HOSPITAL_COMMUNITY)
Admission: RE | Admit: 2023-10-15 | Discharge: 2023-10-15 | Disposition: A | Discharge status: Home / Self Care | Payer: Self-pay | Source: Ambulatory Visit

## 2023-10-15 VITALS — BP 148/94 | HR 99 | Temp 98.1°F | Resp 20

## 2023-10-15 DIAGNOSIS — J029 Acute pharyngitis, unspecified: Secondary | ICD-10-CM

## 2023-10-15 MED ORDER — DEXAMETHASONE 4 MG PO TABS
8.0000 mg | ORAL_TABLET | Freq: Once | ORAL | Status: AC
  Administered 2023-10-15: 8 mg via ORAL

## 2023-10-15 MED ORDER — DEXAMETHASONE 4 MG PO TABS
ORAL_TABLET | ORAL | Status: AC
  Filled 2023-10-15: qty 2

## 2023-10-15 NOTE — ED Triage Notes (Addendum)
 Says tonsils are swollen, but have improved since starting 10/04/2023.  Has pain, but has improved.    Denies having had a runny nose or coughing.  Denies fever.  Patient reports he took tylenol.     Patient has been out of routine medicines for 2 months.  Patient has outstanding bill with provider

## 2023-10-15 NOTE — Discharge Instructions (Signed)
 We believe that your symptoms are likely due to a virus.  You can take Tylenol or ibuprofen for pain relief.  We recommend following up with your primary care provider if symptoms are not improving.  Please return or go to the emergency department if you have any worsening of symptoms, difficulty breathing, difficulty swallowing, fever, severe neck pain, begin drooling, or if you have any other concerns.

## 2023-10-15 NOTE — ED Provider Notes (Signed)
 [facility]    CSN: [id number] Arrival date & time: 10/15/23  1029      History   Chief Complaint Chief Complaint  Patient presents with   Appointment    10:30   Oral Swelling   Sore Throat    HPI Scott Sutton is a 43 y.o. male.   HPI Patient is a 43 year old male with who presents to the urgent care today with concern of sore throat and swollen tonsils.  He reports his symptoms began about 10 days ago.  He denies any fever, drooling, difficulty breathing, or neck pain, chest pain, shortness of breath, or other concerns at this time.  Additionally, he denies any cough, congestion, vomiting, diarrhea, or rash.  He took some over-the-counter medications to try and help with the symptoms.  He reports that the swelling and soreness has improved somewhat but has not resolved completely yet. Past Medical History:  Diagnosis Date   Hyperlipidemia    Hypertension    Hypothyroidism    Testosterone  deficiency     Patient Active Problem List   Diagnosis Date Noted   Current every day nicotine vapor product user 02/09/2021   Sigmoid diverticulosis 02/09/2021   Erectile dysfunction 08/02/2020   Insulin  resistance 08/01/2020   Hypogonadism in male 09/01/2019   B12 deficiency 06/21/2019   Medication management 06/21/2019   Morbid obesity (HCC) 05/13/2018   Vitamin D  deficiency 05/13/2018   Eczema 05/13/2018   History of migraine 05/13/2018   Hypercholesteremia 05/13/2018   Smoker 04/13/2018   Hypertension    Hypothyroidism     Past Surgical History:  Procedure Laterality Date   NO PAST SURGERIES         Home Medications    Prior to Admission medications   Medication Sig Start Date End Date Taking? Authorizing Provider  Ascorbic Acid (VITAMIN C PO) Take by mouth daily.    [provider]  Cyanocobalamin (B-12) 500 MCG SUBL One daily 08/17/18   Collier, Amanda R, PA-C  levothyroxine  (SYNTHROID ) 75 MCG tablet Take  1 tablet  Daily  on an empty stomach  with only water for 30 minutes & no Antacid meds, Calcium or Magnesium for 4 hours & avoid Biotin 10/23/21   Tonita Fallow, MD  metFORMIN  (GLUCOPHAGE -XR) 500 MG 24 hr tablet Take  2 tablets   2 x /day  with Meals  for Diabetes 10/23/21   Tonita Fallow, MD  NEEDLE, DISP, 21 G 21G X 1 MISC Use for testosterone  injections, 3 ml 07/26/19   Collier, Amanda R, PA-C  olmesartan  (BENICAR ) 40 MG tablet Take 1 tablet Daily for BP & Diabetic Kidney Protection 10/23/21   Tonita Fallow, MD  sildenafil  (VIAGRA ) 100 MG tablet TAKE 1/2 TO 1 (ONE-HALF TO ONE) TABLET BY MOUTH 1 HOUR PRIOR TO INTERCOURSE AS NEEDED 05/31/21   Wilkinson, Dana E, NP  testosterone  cypionate (DEPOTESTOSTERONE CYPIONATE) 200 MG/ML injection inject into the muscle every 2 weeks Patient not taking: Reported on 12/13/2021 05/31/21   Wilkinson, Dana E, NP  zinc  gluconate 50 MG tablet Take 1 tablet (50 mg total) by mouth daily. 08/17/18   Craig Alan SAUNDERS, PA-C    Family History Family History  Problem Relation Age of Onset   Cancer Mother        endometrial cancer   Hypertension Father    Stroke Father    Aneurysm Father 56   Diabetes Paternal Aunt    Diabetes Paternal Uncle    Stroke Maternal Grandmother  AAA (abdominal aortic aneurysm) Maternal Grandmother    Cancer Maternal Grandfather        unknown type   Stroke Paternal Grandfather    Crohn's disease Sister    Hypertension Sister    Diabetes Paternal Grandmother    Hyperlipidemia Paternal Grandmother     Social History Social History   Tobacco Use   Smoking status: Former    Current packs/day: 0.00    Average packs/day: 1 pack/day for 20.0 years (20.0 ttl pk-yrs)    Types: Cigarettes    Start date: 04/13/1993    Quit date: 04/13/2013    Years since quitting: 10.5   Smokeless tobacco: Never  Vaping Use   Vaping status: Every Day   Start date: 04/13/2014   Substances: Nicotine, Flavoring   Devices: Mode  Substance Use Topics   Alcohol use: Not Currently     Alcohol/week: 0.0 standard drinks of alcohol   Drug use: Not Currently    Types: Marijuana, Amphetamines, Cocaine, Crack cocaine     Allergies   Patient has no known allergies.   Review of Systems Review of Systems See HPI for relevant ROS.  Physical Exam Triage Vital Signs ED Triage Vitals  Encounter Vitals Group     BP 10/15/23 1114 (!) 148/94     Girls Systolic BP Percentile --      Girls Diastolic BP Percentile --      Boys Systolic BP Percentile --      Boys Diastolic BP Percentile --      Pulse Rate 10/15/23 1114 99     Resp 10/15/23 1114 20     Temp 10/15/23 1114 98.1 F (36.7 C)     Temp Source 10/15/23 1114 Oral     SpO2 10/15/23 1114 97 %     Weight --      Height --      Head Circumference --      Peak Flow --      Pain Score 10/15/23 1110 3     Pain Loc --      Pain Education --      Exclude from Growth Chart --    No data found.  Updated Vital Signs BP (!) 148/94 (BP Location: Right Arm) Comment (BP Location): large cuff  Pulse 99   Temp 98.1 F (36.7 C) (Oral)   Resp 20   SpO2 97%   Visual Acuity Right Eye Distance:   Left Eye Distance:   Bilateral Distance:    Right Eye Near:   Left Eye Near:    Bilateral Near:     Physical Exam General: Alert and oriented, well-developed/well-nourished, calm, cooperative, no acute distress HEENT: Normocephalic atraumatic, moist mucous membranes, no scleral icterus, trachea midline, pharynx without erythema with tonsillar swelling without exudates, uvula midline, no tenderness to palpation of the neck Lungs: Speaking full sentences, non-labored respirations, no distress, clear to auscultation bilaterally Heart: Regular rate and rhythm Abdomen:  Soft, nondistended Musculoskeletal: Moves all extremities well Neurologic: Awake, A&O x4, gait normal Integumentary: Warm, dry, normal for ethnicity, intact, no rash Psychiatric: Appropriate mood & affect  UC Treatments / Results  Labs (all labs ordered  are listed, but only abnormal results are displayed) Labs Reviewed - No data to display  EKG   Radiology No results found.  Procedures Procedures (including critical care time)  Medications Ordered in UC Medications  dexamethasone  (DECADRON ) tablet 8 mg (8 mg Oral Given 10/15/23 1137)    Initial Impression / Assessment and Plan /  UC Course  I have reviewed the triage vital signs and the nursing notes.  Pertinent labs & imaging results that were available during my care of the patient were reviewed by me and considered in my medical decision making (see chart for details).    Presents with sore throat and swollen tonsils.  Differential diagnosis includes: Bacterial pharyngitis, viral pharyngitis, influenza, covid, URI, PTA, RPA, epiglottitis, bacterial tracheitis, EBV, Ludwigs angina, acute HIV, including other diagnoses.  History obtained from: Patient.  Plan: Patient presented with sore throat. Rapid strep was obtained and was negative. The patient did not have trismus, hot potato voice, uvula deviation, unilateral tonsillar swelling, toxic appearance, prolonged course/posterior LAD/splenomegaly, drooling, or pain with movement of the trachea. Additionally, patient able to tolerate PO medicines outpatient.  Discussed with patient that his symptoms are likely viral.  Offered to test for mono but patient declined.  Patient denies any issues with blood sugar or diabetes so a one-time dose of dexamethasone  was given. Patient can take ibuprofen and Tylenol as needed for pain relief. Patient should follow up with their PCP in the next several days. Strict return precautions to the urgent care or emergency department were discussed including if they experience any difficulty breathing, inability to swallow, drooling, have neck pain with movement, symptoms worsen, or if there are any other concerns.   Disposition: Stable to discharge home.    All questions answered to the best of this  examiner's ability. Advised to f/u with PCP for further eval and/or reassessment. Patient agrees to plan.  An appropriate evaluation has been performed, and in my medical judgment there is currently no evidence of an immediate life-threatening or surgical condition. Discharge is therefore indicated at this time.  This document was created using the aid of voice recognition Scientist, clinical (histocompatibility and immunogenetics).  Final Clinical Impressions(s) / UC Diagnoses   Final diagnoses:  Pharyngitis, unspecified etiology     Discharge Instructions      We believe that your symptoms are likely due to a virus.  You can take Tylenol or ibuprofen for pain relief.  We recommend following up with your primary care provider if symptoms are not improving.  Please return or go to the emergency department if you have any worsening of symptoms, difficulty breathing, difficulty swallowing, fever, severe neck pain, begin drooling, or if you have any other concerns.    ED Prescriptions   None    PDMP not reviewed this encounter.   Melonie Locus, PA-C 10/15/23 1139

## 2023-10-27 ENCOUNTER — Encounter (HOSPITAL_BASED_OUTPATIENT_CLINIC_OR_DEPARTMENT_OTHER): Payer: Self-pay | Admitting: Emergency Medicine

## 2023-10-27 ENCOUNTER — Emergency Department (HOSPITAL_BASED_OUTPATIENT_CLINIC_OR_DEPARTMENT_OTHER)
Admission: EM | Admit: 2023-10-27 | Discharge: 2023-10-27 | Disposition: A | Discharge status: Home / Self Care | Payer: Self-pay | Attending: Emergency Medicine | Admitting: Emergency Medicine

## 2023-10-27 ENCOUNTER — Other Ambulatory Visit: Payer: Self-pay

## 2023-10-27 ENCOUNTER — Emergency Department (HOSPITAL_BASED_OUTPATIENT_CLINIC_OR_DEPARTMENT_OTHER): Payer: Self-pay

## 2023-10-27 DIAGNOSIS — R197 Diarrhea, unspecified: Secondary | ICD-10-CM | POA: Insufficient documentation

## 2023-10-27 DIAGNOSIS — S9031XA Contusion of right foot, initial encounter: Secondary | ICD-10-CM | POA: Insufficient documentation

## 2023-10-27 DIAGNOSIS — R509 Fever, unspecified: Secondary | ICD-10-CM | POA: Insufficient documentation

## 2023-10-27 DIAGNOSIS — X58XXXA Exposure to other specified factors, initial encounter: Secondary | ICD-10-CM | POA: Insufficient documentation

## 2023-10-27 DIAGNOSIS — E039 Hypothyroidism, unspecified: Secondary | ICD-10-CM | POA: Insufficient documentation

## 2023-10-27 DIAGNOSIS — R5383 Other fatigue: Secondary | ICD-10-CM | POA: Insufficient documentation

## 2023-10-27 DIAGNOSIS — R110 Nausea: Secondary | ICD-10-CM | POA: Insufficient documentation

## 2023-10-27 DIAGNOSIS — I10 Essential (primary) hypertension: Secondary | ICD-10-CM | POA: Insufficient documentation

## 2023-10-27 DIAGNOSIS — Z7984 Long term (current) use of oral hypoglycemic drugs: Secondary | ICD-10-CM | POA: Insufficient documentation

## 2023-10-27 DIAGNOSIS — R531 Weakness: Secondary | ICD-10-CM | POA: Insufficient documentation

## 2023-10-27 LAB — URINALYSIS, ROUTINE W REFLEX MICROSCOPIC
Bacteria, UA: NONE SEEN
Bilirubin Urine: NEGATIVE
Glucose, UA: NEGATIVE mg/dL
Ketones, ur: NEGATIVE mg/dL
Nitrite: NEGATIVE
Protein, ur: 100 mg/dL — AB
Specific Gravity, Urine: 1.021 (ref 1.005–1.030)
pH: 5.5 (ref 5.0–8.0)

## 2023-10-27 LAB — COMPREHENSIVE METABOLIC PANEL WITH GFR
ALT: 23 U/L (ref 0–44)
AST: 19 U/L (ref 15–41)
Albumin: 3.9 g/dL (ref 3.5–5.0)
Alkaline Phosphatase: 88 U/L (ref 38–126)
Anion gap: 16 — ABNORMAL HIGH (ref 5–15)
BUN: 13 mg/dL (ref 6–20)
CO2: 20 mmol/L — ABNORMAL LOW (ref 22–32)
Calcium: 9.8 mg/dL (ref 8.9–10.3)
Chloride: 101 mmol/L (ref 98–111)
Creatinine, Ser: 1.08 mg/dL (ref 0.61–1.24)
GFR, Estimated: >60 mL/min (ref >60)
Glucose, Bld: 103 mg/dL — ABNORMAL HIGH (ref 70–99)
Potassium: 3.6 mmol/L (ref 3.5–5.1)
Sodium: 137 mmol/L (ref 135–145)
Total Bilirubin: 0.4 mg/dL (ref 0.0–1.2)
Total Protein: 8.1 g/dL (ref 6.5–8.1)

## 2023-10-27 LAB — CBC
HCT: 46.8 % (ref 39.0–52.0)
Hemoglobin: 15.6 g/dL (ref 13.0–17.0)
MCH: 29.6 pg (ref 26.0–34.0)
MCHC: 33.3 g/dL (ref 30.0–36.0)
MCV: 88.8 fL (ref 80.0–100.0)
Platelets: 273 K/uL (ref 150–400)
RBC: 5.27 MIL/uL (ref 4.22–5.81)
RDW: 14.1 % (ref 11.5–15.5)
WBC: 12 K/uL — ABNORMAL HIGH (ref 4.0–10.5)
nRBC: 0 % (ref 0.0–0.2)

## 2023-10-27 LAB — LIPASE, BLOOD: Lipase: 76 U/L — ABNORMAL HIGH (ref 11–51)

## 2023-10-27 LAB — LACTIC ACID, PLASMA: Lactic Acid, Venous: 1.3 mmol/L (ref 0.5–1.9)

## 2023-10-27 MED ORDER — ONDANSETRON 4 MG PO TBDP: 4.0000 mg | ORAL_TABLET | Freq: Three times a day (TID) | ORAL | 0 refills | Status: AC | PRN

## 2023-10-27 MED ORDER — SODIUM CHLORIDE 0.9 % IV BOLUS
1000.0000 mL | Freq: Once | INTRAVENOUS | Status: AC
  Administered 2023-10-27: 1000 mL via INTRAVENOUS

## 2023-10-27 NOTE — ED Provider Notes (Signed)
 [place] [facility] AT [facility] Provider Note   CSN: [id number] Arrival date & time: 10/27/23  1246     Patient presents with: Diarrhea and Foot Pain   Scott Sutton is a 43 y.o. male.  {Add pertinent medical, surgical, social history, OB history to YEP:67052} Patient with history of hypothyroid, HTN presents with symptoms that started 2 days ago with generalized fatigue and weakness, progressing to include copious nonbloody diarrhea and fever with Tmax 101 yesterday. He reports nausea but no vomiting. No cough, SOB, HA, urinary symptoms. He is eating and drinking well, and continues to have normal volume urination. No focal pain other than abdominal cramping associated with bowel movements. No sick contacts. He also reports symptoms of right foot pain and swelling that started 2 weeks ago after he feels he was bitten by something causing a sore to the dorsal foot.   The history is provided by the patient. No language interpreter was used.  Diarrhea Foot Pain       Prior to Admission medications   Medication Sig Start Date End Date Taking? Authorizing Provider  Ascorbic Acid (VITAMIN C PO) Take by mouth daily.    [provider]  Cyanocobalamin (B-12) 500 MCG SUBL One daily 08/17/18   Collier, Amanda R, PA-C  levothyroxine  (SYNTHROID ) 75 MCG tablet Take  1 tablet  Daily  on an empty stomach with only water for 30 minutes & no Antacid meds, Calcium or Magnesium for 4 hours & avoid Biotin 10/23/21   Tonita Fallow, MD  metFORMIN  (GLUCOPHAGE -XR) 500 MG 24 hr tablet Take  2 tablets   2 x /day  with Meals  for Diabetes 10/23/21   Tonita Fallow, MD  NEEDLE, DISP, 21 G 21G X 1 MISC Use for testosterone  injections, 3 ml 07/26/19   Collier, Amanda R, PA-C  olmesartan  (BENICAR ) 40 MG tablet Take 1 tablet Daily for BP & Diabetic Kidney Protection 10/23/21   Tonita Fallow, MD  sildenafil  (VIAGRA ) 100 MG tablet TAKE 1/2 TO 1 (ONE-HALF TO ONE) TABLET BY MOUTH 1  HOUR PRIOR TO INTERCOURSE AS NEEDED 05/31/21   Wilkinson, Dana E, NP  testosterone  cypionate (DEPOTESTOSTERONE CYPIONATE) 200 MG/ML injection inject into the muscle every 2 weeks Patient not taking: Reported on 12/13/2021 05/31/21   Wilkinson, Dana E, NP  zinc  gluconate 50 MG tablet Take 1 tablet (50 mg total) by mouth daily. 08/17/18   Craig Alan SAUNDERS, PA-C    Allergies: Patient has no known allergies.    Review of Systems  Gastrointestinal:  Positive for diarrhea.    Updated Vital Signs BP (!) 138/100 (BP Location: Right Arm)   Pulse (!) 107   Temp 98.1 F (36.7 C) (Oral)   Resp 20   SpO2 100%   Physical Exam Vitals and nursing note reviewed.  Constitutional:      General: He is not in acute distress.    Appearance: He is well-developed. He is not diaphoretic.  HENT:     Head: Normocephalic.     Mouth/Throat:     Mouth: Mucous membranes are moist.  Cardiovascular:     Rate and Rhythm: Normal rate and regular rhythm.     Heart sounds: No murmur heard. Pulmonary:     Effort: Pulmonary effort is normal.     Breath sounds: Normal breath sounds. No wheezing, rhonchi or rales.  Abdominal:     General: There is no distension.     Palpations: Abdomen is soft.  Tenderness: There is no abdominal tenderness. There is no guarding or rebound.  Musculoskeletal:        General: Normal range of motion.     Cervical back: Normal range of motion and neck supple.     Comments: Right foot is moderately swollen with ecchymosis over dorsal forefoot. Minimal nonpitting swelling of the distal right lower leg. No skin breakdown.   Skin:    General: Skin is warm and dry.  Neurological:     General: No focal deficit present.     Mental Status: He is alert and oriented to person, place, and time.     (all labs ordered are listed, but only abnormal results are displayed) Labs Reviewed  CBC - Abnormal; Notable for the following components:      Result Value   WBC 12.0 (*)    All  other components within normal limits  LIPASE, BLOOD  COMPREHENSIVE METABOLIC PANEL WITH GFR  URINALYSIS, ROUTINE W REFLEX MICROSCOPIC  LACTIC ACID, PLASMA  LACTIC ACID, PLASMA    EKG: None  Radiology: No results found.  {Document cardiac monitor, telemetry assessment procedure when appropriate:32947} Procedures   Medications Ordered in the ED  sodium chloride  0.9 % bolus 1,000 mL (has no administration in time range)      {Click here for ABCD2, HEART and other calculators REFRESH Note before signing:1}                              Medical Decision Making This patient presents to the ED for concern of generalized weakness, diarrhea, this involves an extensive number of treatment options, and is a complaint that carries with it a high risk of complications and morbidity.  The differential diagnosis includes viral syndrome, infectious diarrhea, dehydration   Co morbidities that complicate the patient evaluation  Pre-diabetes, HTN, hypothyroid   Additional history obtained:  Additional history and/or information obtained from chart review, notable for No frequent ED usage.   Lab Tests:  I Ordered, and personally interpreted labs.  The pertinent results include:  ***    Imaging Studies ordered:  I ordered imaging studies including *** I independently visualized and interpreted imaging which showed *** I agree with the radiologist interpretation   Cardiac Monitoring:  The patient was maintained on a cardiac monitor.  I personally viewed and interpreted the cardiac monitored which showed an underlying rhythm of: ***   Medicines ordered and prescription drug management:  I ordered medication including ***  for *** Reevaluation of the patient after these medicines showed that the patient {resolved/improved/worsened:23923::improved} I have reviewed the patients home medicines and have made adjustments as needed   Test Considered:  ***   Critical  Interventions:  ***   Consultations Obtained:  I requested consultation with the ***,  and discussed lab and imaging findings as well as pertinent plan - they recommend: ***   Problem List / ED Course:  ***   Reevaluation:  After the interventions noted above, I reevaluated the patient and found that they have :{resolved/improved/worsened:23923::improved}   Social Determinants of Health:  ***   Disposition:  After consideration of the diagnostic results and the patients response to treatment, I feel that the patient would benefit from ***.   Amount and/or Complexity of Data Reviewed Labs: ordered. Radiology: ordered.   ***  {Document critical care time when appropriate  Document review of labs and clinical decision tools ie CHADS2VASC2, etc  Document your independent  review of radiology images and any outside records  Document your discussion with family members, caretakers and with consultants  Document social determinants of health affecting pt's care  Document your decision making why or why not admission, treatments were needed:32947:::1}   Final diagnoses:  None    ED Discharge Orders     None

## 2023-10-27 NOTE — Discharge Instructions (Signed)
 As we discussed, take Tylenol for any fever. If having >5 bowel movements daily, recommend Imodium to avoid dehydration. Keep your fluid intake up.   A prescription for Zofran  has been called to your pharmacy. Take this if needed for nausea.   Return to the ED with any worsening symptoms - high fever, severe pain, bloody diarrhea or new concern. Otherwise, follow up with your doctor for recheck in 3-4 days if symptoms persist.

## 2023-10-27 NOTE — ED Triage Notes (Signed)
 Fatigue, diarrhea just feel like crap Started Saturday  Right foot pain x 1 weeks, denies injury, bruising to top of foot, limping to triage  Fever 101 yesterday

## 2024-02-26 ENCOUNTER — Encounter: Payer: Self-pay | Admitting: *Deleted

## 2024-02-26 NOTE — Progress Notes (Signed)
 Scott Sutton                                          MRN: [id number]   02/26/2024   The [facility] Quality Team Specialist reviewed this patient medical record for the purposes of chart review for care gap closure. The following were reviewed: chart review for care gap closure-controlling blood pressure.    [facility] Quality Team
